# Patient Record
Sex: Male | Born: 1985 | Hispanic: Yes | Marital: Single | Smoking: Never smoker
Health system: Southern US, Community
[De-identification: ages and names within clinical notes are randomized; demographics above are authoritative.]

## PROBLEM LIST (undated history)

## (undated) DIAGNOSIS — I10 Essential (primary) hypertension: Secondary | ICD-10-CM

---

## 2013-12-25 ENCOUNTER — Encounter (HOSPITAL_COMMUNITY): Payer: Self-pay | Admitting: Emergency Medicine

## 2013-12-25 ENCOUNTER — Encounter (HOSPITAL_COMMUNITY): Payer: Self-pay | Admitting: Certified Registered"

## 2013-12-25 ENCOUNTER — Encounter (HOSPITAL_COMMUNITY)
Admission: EM | Disposition: A | Discharge status: Home / Self Care | Payer: Self-pay | Source: Home / Self Care | Attending: Orthopaedic Surgery

## 2013-12-25 ENCOUNTER — Emergency Department (HOSPITAL_COMMUNITY): Payer: Self-pay

## 2013-12-25 ENCOUNTER — Inpatient Hospital Stay (HOSPITAL_COMMUNITY)
Admission: EM | Admit: 2013-12-25 | Discharge: 2013-12-31 | DRG: 577 | Disposition: A | Discharge status: Home / Self Care | Payer: Self-pay | Attending: Orthopaedic Surgery | Admitting: Orthopaedic Surgery

## 2013-12-25 ENCOUNTER — Emergency Department (HOSPITAL_COMMUNITY): Payer: Self-pay | Admitting: Certified Registered"

## 2013-12-25 DIAGNOSIS — S81819A Laceration without foreign body, unspecified lower leg, initial encounter: Secondary | ICD-10-CM | POA: Diagnosis present

## 2013-12-25 DIAGNOSIS — S86929A Laceration of unspecified muscle(s) and tendon(s) at lower leg level, unspecified leg, initial encounter: Secondary | ICD-10-CM

## 2013-12-25 DIAGNOSIS — S81809A Unspecified open wound, unspecified lower leg, initial encounter: Principal | ICD-10-CM

## 2013-12-25 DIAGNOSIS — D62 Acute posthemorrhagic anemia: Secondary | ICD-10-CM | POA: Diagnosis not present

## 2013-12-25 DIAGNOSIS — S81009A Unspecified open wound, unspecified knee, initial encounter: Principal | ICD-10-CM | POA: Diagnosis present

## 2013-12-25 DIAGNOSIS — S91009A Unspecified open wound, unspecified ankle, initial encounter: Principal | ICD-10-CM

## 2013-12-25 DIAGNOSIS — S81812A Laceration without foreign body, left lower leg, initial encounter: Secondary | ICD-10-CM | POA: Diagnosis present

## 2013-12-25 DIAGNOSIS — I10 Essential (primary) hypertension: Secondary | ICD-10-CM | POA: Diagnosis present

## 2013-12-25 HISTORY — PX: I & D EXTREMITY: SHX5045

## 2013-12-25 HISTORY — PX: APPLICATION OF WOUND VAC: SHX5189

## 2013-12-25 HISTORY — DX: Essential (primary) hypertension: I10

## 2013-12-25 LAB — COMPREHENSIVE METABOLIC PANEL
ALT: 85 U/L — AB (ref 0–53)
AST: 70 U/L — ABNORMAL HIGH (ref 0–37)
Albumin: 4.5 g/dL (ref 3.5–5.2)
Alkaline Phosphatase: 82 U/L (ref 39–117)
BUN: 25 mg/dL — AB (ref 6–23)
CALCIUM: 9 mg/dL (ref 8.4–10.5)
CO2: 23 meq/L (ref 19–32)
CREATININE: 1.03 mg/dL (ref 0.50–1.35)
Chloride: 101 mEq/L (ref 96–112)
GFR calc non Af Amer: >90 mL/min (ref >90)
Glucose, Bld: 126 mg/dL — ABNORMAL HIGH (ref 70–99)
Potassium: 3.9 mEq/L (ref 3.7–5.3)
Sodium: 140 mEq/L (ref 137–147)
Total Bilirubin: 0.4 mg/dL (ref 0.3–1.2)
Total Protein: 7.8 g/dL (ref 6.0–8.3)

## 2013-12-25 LAB — CBC
HCT: 41 % (ref 39.0–52.0)
HEMOGLOBIN: 14.2 g/dL (ref 13.0–17.0)
MCH: 31.8 pg (ref 26.0–34.0)
MCHC: 34.6 g/dL (ref 30.0–36.0)
MCV: 91.9 fL (ref 78.0–100.0)
Platelets: 308 10*3/uL (ref 150–400)
RBC: 4.46 MIL/uL (ref 4.22–5.81)
RDW: 12.3 % (ref 11.5–15.5)
WBC: 9.7 10*3/uL (ref 4.0–10.5)

## 2013-12-25 LAB — PROTIME-INR
INR: 1.02 (ref 0.00–1.49)
PROTHROMBIN TIME: 13.2 s (ref 11.6–15.2)

## 2013-12-25 LAB — SAMPLE TO BLOOD BANK

## 2013-12-25 LAB — ETHANOL: Alcohol, Ethyl (B): <11 mg/dL (ref 0–11)

## 2013-12-25 SURGERY — IRRIGATION AND DEBRIDEMENT EXTREMITY
Anesthesia: General | Site: Leg Lower | Laterality: Left

## 2013-12-25 MED ORDER — LACTATED RINGERS IV SOLN
INTRAVENOUS | Status: DC | PRN
  Administered 2013-12-25: 21:00:00 via INTRAVENOUS

## 2013-12-25 MED ORDER — SENNA 8.6 MG PO TABS
1.0000 | ORAL_TABLET | Freq: Two times a day (BID) | ORAL | Status: DC
  Administered 2013-12-26 – 2013-12-31 (×9): 8.6 mg via ORAL
  Filled 2013-12-25 (×13): qty 1

## 2013-12-25 MED ORDER — SODIUM CHLORIDE 0.9 % IJ SOLN
INTRAMUSCULAR | Status: AC
  Filled 2013-12-25: qty 10

## 2013-12-25 MED ORDER — SUFENTANIL CITRATE 50 MCG/ML IV SOLN
INTRAVENOUS | Status: DC | PRN
  Administered 2013-12-25 (×2): 10 ug via INTRAVENOUS

## 2013-12-25 MED ORDER — DEXAMETHASONE SODIUM PHOSPHATE 4 MG/ML IJ SOLN
INTRAMUSCULAR | Status: DC | PRN
  Administered 2013-12-25: 4 mg via INTRAVENOUS

## 2013-12-25 MED ORDER — SUCCINYLCHOLINE CHLORIDE 20 MG/ML IJ SOLN
INTRAMUSCULAR | Status: AC
  Filled 2013-12-25: qty 1

## 2013-12-25 MED ORDER — LIDOCAINE HCL (CARDIAC) 20 MG/ML IV SOLN
INTRAVENOUS | Status: AC
  Filled 2013-12-25: qty 5

## 2013-12-25 MED ORDER — SUCCINYLCHOLINE CHLORIDE 20 MG/ML IJ SOLN
INTRAMUSCULAR | Status: DC | PRN
  Administered 2013-12-25: 120 mg via INTRAVENOUS

## 2013-12-25 MED ORDER — PROPOFOL 10 MG/ML IV BOLUS
INTRAVENOUS | Status: AC
  Filled 2013-12-25: qty 20

## 2013-12-25 MED ORDER — OXYCODONE HCL 5 MG PO TABS
5.0000 mg | ORAL_TABLET | Freq: Once | ORAL | Status: AC | PRN
  Administered 2013-12-25: 5 mg via ORAL

## 2013-12-25 MED ORDER — MIDAZOLAM HCL 2 MG/2ML IJ SOLN
INTRAMUSCULAR | Status: AC
  Filled 2013-12-25: qty 2

## 2013-12-25 MED ORDER — DEXAMETHASONE SODIUM PHOSPHATE 4 MG/ML IJ SOLN
INTRAMUSCULAR | Status: AC
  Filled 2013-12-25: qty 1

## 2013-12-25 MED ORDER — ONDANSETRON HCL 4 MG/2ML IJ SOLN
INTRAMUSCULAR | Status: DC | PRN
  Administered 2013-12-25: 4 mg via INTRAVENOUS

## 2013-12-25 MED ORDER — METOCLOPRAMIDE HCL 10 MG PO TABS: 5.0000 mg | ORAL_TABLET | Freq: Three times a day (TID) | ORAL | Status: DC | PRN

## 2013-12-25 MED ORDER — TETANUS-DIPHTH-ACELL PERTUSSIS 5-2.5-18.5 LF-MCG/0.5 IM SUSP
0.5000 mL | Freq: Once | INTRAMUSCULAR | Status: AC
  Administered 2013-12-25: 0.5 mL via INTRAMUSCULAR
  Filled 2013-12-25: qty 0.5

## 2013-12-25 MED ORDER — DEXTROSE 5 % IV SOLN
200.0000 mg | Freq: Once | INTRAVENOUS | Status: AC
  Administered 2013-12-25: 200 mg via INTRAVENOUS
  Filled 2013-12-25 (×2): qty 5

## 2013-12-25 MED ORDER — HYDROCODONE-ACETAMINOPHEN 5-325 MG PO TABS: 1.0000 | ORAL_TABLET | ORAL | Status: DC | PRN

## 2013-12-25 MED ORDER — ONDANSETRON HCL 4 MG PO TABS: 4.0000 mg | ORAL_TABLET | Freq: Four times a day (QID) | ORAL | Status: DC | PRN

## 2013-12-25 MED ORDER — METOCLOPRAMIDE HCL 5 MG/ML IJ SOLN: 5.0000 mg | Freq: Three times a day (TID) | INTRAMUSCULAR | Status: DC | PRN

## 2013-12-25 MED ORDER — POLYETHYLENE GLYCOL 3350 17 G PO PACK: 17.0000 g | PACK | Freq: Every day | ORAL | Status: DC | PRN

## 2013-12-25 MED ORDER — SODIUM CHLORIDE 0.9 % IV SOLN
INTRAVENOUS | Status: DC
  Administered 2013-12-25 – 2013-12-28 (×3): via INTRAVENOUS

## 2013-12-25 MED ORDER — SUFENTANIL CITRATE 50 MCG/ML IV SOLN
INTRAVENOUS | Status: AC
  Filled 2013-12-25: qty 1

## 2013-12-25 MED ORDER — OXYCODONE HCL 5 MG PO TABS
ORAL_TABLET | ORAL | Status: AC
  Administered 2013-12-25: 5 mg via ORAL
  Filled 2013-12-25: qty 1

## 2013-12-25 MED ORDER — MORPHINE SULFATE 2 MG/ML IJ SOLN
1.0000 mg | INTRAMUSCULAR | Status: DC | PRN
  Administered 2013-12-26 (×6): 1 mg via INTRAVENOUS
  Filled 2013-12-25 (×6): qty 1

## 2013-12-25 MED ORDER — HYDROMORPHONE HCL PF 1 MG/ML IJ SOLN
0.2500 mg | INTRAMUSCULAR | Status: DC | PRN
  Administered 2013-12-25 (×2): 0.5 mg via INTRAVENOUS

## 2013-12-25 MED ORDER — HYDROMORPHONE HCL PF 1 MG/ML IJ SOLN
INTRAMUSCULAR | Status: AC
  Administered 2013-12-25: 0.5 mg via INTRAVENOUS
  Filled 2013-12-25: qty 1

## 2013-12-25 MED ORDER — MIDAZOLAM HCL 5 MG/5ML IJ SOLN
INTRAMUSCULAR | Status: DC | PRN
  Administered 2013-12-25: 2 mg via INTRAVENOUS

## 2013-12-25 MED ORDER — FENTANYL CITRATE 0.05 MG/ML IJ SOLN
100.0000 ug | Freq: Once | INTRAMUSCULAR | Status: AC
  Administered 2013-12-25: 100 ug via INTRAVENOUS
  Filled 2013-12-25: qty 2

## 2013-12-25 MED ORDER — OXYCODONE HCL 5 MG/5ML PO SOLN: 5.0000 mg | Freq: Once | ORAL | Status: AC | PRN

## 2013-12-25 MED ORDER — ONDANSETRON HCL 4 MG/2ML IJ SOLN
INTRAMUSCULAR | Status: AC
  Filled 2013-12-25: qty 2

## 2013-12-25 MED ORDER — SODIUM CHLORIDE 0.9 % IV SOLN
INTRAVENOUS | Status: DC | PRN
  Administered 2013-12-25: 21:00:00 via INTRAVENOUS

## 2013-12-25 MED ORDER — SORBITOL 70 % SOLN: 30.0000 mL | Freq: Every day | Status: DC | PRN

## 2013-12-25 MED ORDER — CEFAZOLIN SODIUM-DEXTROSE 2-3 GM-% IV SOLR
2.0000 g | Freq: Four times a day (QID) | INTRAVENOUS | Status: AC
  Administered 2013-12-26 (×3): 2 g via INTRAVENOUS
  Filled 2013-12-25 (×3): qty 50

## 2013-12-25 MED ORDER — DIAZEPAM 5 MG PO TABS
5.0000 mg | ORAL_TABLET | Freq: Four times a day (QID) | ORAL | Status: DC | PRN
  Administered 2013-12-27 – 2013-12-31 (×11): 5 mg via ORAL
  Filled 2013-12-25 (×11): qty 1

## 2013-12-25 MED ORDER — ONDANSETRON HCL 4 MG/2ML IJ SOLN: 4.0000 mg | Freq: Once | INTRAMUSCULAR | Status: DC | PRN

## 2013-12-25 MED ORDER — OXYCODONE HCL 5 MG PO TABS
5.0000 mg | ORAL_TABLET | ORAL | Status: DC | PRN
  Administered 2013-12-26 (×2): 10 mg via ORAL
  Administered 2013-12-27 – 2013-12-30 (×11): 15 mg via ORAL
  Filled 2013-12-25 (×4): qty 3
  Filled 2013-12-25: qty 2
  Filled 2013-12-25 (×7): qty 3
  Filled 2013-12-25: qty 2
  Filled 2013-12-25 (×3): qty 3

## 2013-12-25 MED ORDER — CEFAZOLIN SODIUM-DEXTROSE 2-3 GM-% IV SOLR
2.0000 g | Freq: Once | INTRAVENOUS | Status: AC
  Administered 2013-12-25: 2 g via INTRAVENOUS
  Filled 2013-12-25: qty 50

## 2013-12-25 MED ORDER — DIPHENHYDRAMINE HCL 12.5 MG/5ML PO ELIX
25.0000 mg | ORAL_SOLUTION | ORAL | Status: DC | PRN
  Administered 2013-12-27: 25 mg via ORAL
  Filled 2013-12-25 (×2): qty 10

## 2013-12-25 MED ORDER — PROPOFOL 10 MG/ML IV BOLUS
INTRAVENOUS | Status: DC | PRN
  Administered 2013-12-25: 200 mg via INTRAVENOUS
  Administered 2013-12-25: 50 mg via INTRAVENOUS

## 2013-12-25 MED ORDER — MAGNESIUM CITRATE PO SOLN: 1.0000 | Freq: Once | ORAL | Status: AC | PRN

## 2013-12-25 MED ORDER — FENTANYL CITRATE 0.05 MG/ML IJ SOLN
50.0000 ug | INTRAMUSCULAR | Status: DC | PRN
  Administered 2013-12-25 (×2): 50 ug via INTRAVENOUS
  Filled 2013-12-25 (×2): qty 2

## 2013-12-25 MED ORDER — PENICILLIN G BENZATHINE 1200000 UNIT/2ML IM SUSP
1.2000 10*6.[IU] | Freq: Once | INTRAMUSCULAR | Status: AC
  Administered 2013-12-25: 1.2 10*6.[IU] via INTRAMUSCULAR
  Filled 2013-12-25: qty 2

## 2013-12-25 MED ORDER — LIDOCAINE HCL (CARDIAC) 20 MG/ML IV SOLN
INTRAVENOUS | Status: DC | PRN
  Administered 2013-12-25: 100 mg via INTRAVENOUS

## 2013-12-25 MED ORDER — ONDANSETRON HCL 4 MG/2ML IJ SOLN: 4.0000 mg | Freq: Four times a day (QID) | INTRAMUSCULAR | Status: DC | PRN

## 2013-12-25 MED ORDER — SODIUM CHLORIDE 0.9 % IR SOLN
Status: DC | PRN
  Administered 2013-12-25 (×2): 3000 mL

## 2013-12-25 SURGICAL SUPPLY — 31 items
BNDG COHESIVE 4X5 TAN STRL (GAUZE/BANDAGES/DRESSINGS) ×3 IMPLANT
CANISTER WOUND CARE 500ML ATS (WOUND CARE) ×3 IMPLANT
COVER SURGICAL LIGHT HANDLE (MISCELLANEOUS) ×3 IMPLANT
CUFF TOURNIQUET SINGLE 34IN LL (TOURNIQUET CUFF) ×3 IMPLANT
DRAPE INCISE IOBAN 66X45 STRL (DRAPES) IMPLANT
DRAPE U-SHAPE 47X51 STRL (DRAPES) ×3 IMPLANT
DRSG EMULSION OIL 3X3 NADH (GAUZE/BANDAGES/DRESSINGS) ×3 IMPLANT
DRSG VAC ATS SM SENSATRAC (GAUZE/BANDAGES/DRESSINGS) ×3 IMPLANT
ELECT CAUTERY BLADE 6.4 (BLADE) ×3 IMPLANT
ELECT REM PT RETURN 9FT ADLT (ELECTROSURGICAL) ×3
ELECTRODE REM PT RTRN 9FT ADLT (ELECTROSURGICAL) ×1 IMPLANT
FACESHIELD WRAPAROUND (MASK) ×3 IMPLANT
GLOVE SURG SS PI 7.5 STRL IVOR (GLOVE) ×6 IMPLANT
GOWN STRL REUS W/ TWL LRG LVL3 (GOWN DISPOSABLE) ×1 IMPLANT
GOWN STRL REUS W/ TWL XL LVL3 (GOWN DISPOSABLE) ×1 IMPLANT
GOWN STRL REUS W/TWL LRG LVL3 (GOWN DISPOSABLE) ×2
GOWN STRL REUS W/TWL XL LVL3 (GOWN DISPOSABLE) ×2
KIT BASIN OR (CUSTOM PROCEDURE TRAY) ×3 IMPLANT
KIT ROOM TURNOVER OR (KITS) ×3 IMPLANT
MANIFOLD NEPTUNE II (INSTRUMENTS) ×3 IMPLANT
NS IRRIG 1000ML POUR BTL (IV SOLUTION) ×3 IMPLANT
PACK ORTHO EXTREMITY (CUSTOM PROCEDURE TRAY) ×3 IMPLANT
PAD ARMBOARD 7.5X6 YLW CONV (MISCELLANEOUS) ×6 IMPLANT
SUT ETHILON 2 0 PSLX (SUTURE) ×3 IMPLANT
TOWEL OR 17X24 6PK STRL BLUE (TOWEL DISPOSABLE) ×3 IMPLANT
TOWEL OR 17X26 10 PK STRL BLUE (TOWEL DISPOSABLE) ×3 IMPLANT
TUBE CONNECTING 12'X1/4 (SUCTIONS) ×1
TUBE CONNECTING 12X1/4 (SUCTIONS) ×2 IMPLANT
TUBING CYSTO DISP (UROLOGICAL SUPPLIES) ×3 IMPLANT
UNDERPAD 30X30 INCONTINENT (UNDERPADS AND DIAPERS) ×3 IMPLANT
YANKAUER SUCT BULB TIP NO VENT (SUCTIONS) ×3 IMPLANT

## 2013-12-25 NOTE — H&P (Addendum)
ORTHOPAEDIC HISTORY AND PHYSICAL   Chief Complaint: left leg injury  HPI: Cory Lowery is a 28 y.o. hispanic male who complains of left leg injury after a forklift ran into his left lateral calf.  He was evaluated at an outside ER and transferred here.  Ancef, gentamicin, PCN given in the ER.  Last po intake was 10 am.  Denies any LOC, neck pain, abd pain.  Complaining of numbness in the entire foot.  Ortho consulted for injury.  Past Medical History  Diagnosis Date  . Hypertension    History reviewed. No pertinent past surgical history. History   Social History  . Marital Status: Single    Spouse Name: N/A    Number of Children: N/A  . Years of Education: N/A   Social History Main Topics  . Smoking status: None  . Smokeless tobacco: None  . Alcohol Use: None  . Drug Use: None  . Sexual Activity: None   Other Topics Concern  . None   Social History Narrative  . None   No family history on file. Allergies  Allergen Reactions  . Aleve [Naproxen Sodium]     Itching    Prior to Admission medications   Not on File   Dg Chest 2 View  12/25/2013   CLINICAL DATA:  Left leg injury.  EXAM: CHEST  2 VIEW  COMPARISON:  PA and lateral chest 04/21/2017.  FINDINGS: Lung volumes are low but the lungs are clear. Heart size is normal. No pneumothorax or pleural effusion. No focal bony abnormality.  IMPRESSION: Negative exam.   Electronically Signed   By: Drusilla Kannerhomas  Dalessio M.D.   On: 12/25/2013 17:34   Dg Lumbar Spine Complete  12/25/2013   CLINICAL DATA:  Injured back at work.  EXAM: LUMBAR SPINE - COMPLETE 4+ VIEW  COMPARISON:  None.  FINDINGS: Normal alignment of the lumbar vertebral bodies. Disc spaces and vertebral bodies are maintained. Could not exclude pars defects at L5 but no spondylolisthesis. The visualized bony pelvis is intact.  IMPRESSION: Normal alignment and no acute bony findings or significant degenerative changes.  Possible pars defects at L5 but no  spondylolisthesis.   Electronically Signed   By: Loralie ChampagneMark  Gallerani M.D.   On: 12/25/2013 17:33   Dg Tibia/fibula Left  12/25/2013   CLINICAL DATA:  Open wound.  EXAM: LEFT TIBIA AND FIBULA - 2 VIEW  COMPARISON:  None.  FINDINGS: The knee and ankle joints are maintained. No acute bony abnormality involving the tibia or fibula. There is a soft tissue defect involving the lateral aspect of the mid lower extremity.  IMPRESSION: No acute fracture or radiopaque foreign body.   Electronically Signed   By: Loralie ChampagneMark  Gallerani M.D.   On: 12/25/2013 17:47    Positive ROS: All other systems have been reviewed and were otherwise negative with the exception of those mentioned in the HPI and as above.  Physical Exam: General: Alert, no acute distress Cardiovascular: No pedal edema Respiratory: No cyanosis, no use of accessory musculature GI: No organomegaly, abdomen is soft and non-tender Skin: No lesions in the area of chief complaint Neurologic: Sensation intact distally Psychiatric: Patient is competent for consent with normal mood and affect Lymphatic: No axillary or cervical lymphadenopathy  MUSCULOSKELETAL:  - 10 cm circular wound to the anterolateral calf with exposed muscle and tendon, skin retracted - moderate gross contaminated - able to dorsiflex and plantarflex foot - foot wwp, 2+ pulses - paresthesias in the entire foot in no  dermatomal pattern - compartments soft  Assessment: Left leg injury with significant soft tissue injury, negative for fracture  Plan: - needs formal I&D in OR, may need delayed closure or skin grafting - ancef, gentamicin, pcn, tdap given in ER - consent signed after discussing r/b/a through translator - all questions answered to their satisfaction   N. Glee ArvinMichael Coila Wardell, MD [facility] [phone] 8:10 PM

## 2013-12-25 NOTE — Transfer of Care (Signed)
Immediate Anesthesia Transfer of Care Note  Patient: Cory Lowery  Procedure(s) Performed: Procedure(s): IRRIGATION AND DEBRIDEMENT LEFT LEG (Left) APPLICATION OF WOUND VAC (Left)  Patient Location: PACU  Anesthesia Type:General  Level of Consciousness: awake, alert , oriented and patient cooperative  Airway & Oxygen Therapy: Patient Spontanous Breathing and Patient connected to nasal cannula oxygen  Post-op Assessment: Report given to PACU RN, Post -op Vital signs reviewed and stable and Patient moving all extremities X 4  Post vital signs: Reviewed and stable  Complications: No apparent anesthesia complications

## 2013-12-25 NOTE — ED Notes (Signed)
Patient transported to short stay by this RN. [facility] No questions/concerns. All patient belongings in possession of patients girlfriend.

## 2013-12-25 NOTE — ED Provider Notes (Signed)
CSN: [id number]     Arrival date & time 12/25/13  1545 History   First MD Initiated Contact with Patient 12/25/13 1546     Chief Complaint  Patient presents with  . Leg Injury     (Consider location/radiation/quality/duration/timing/severity/associated sxs/prior Treatment) The history is provided by the patient and medical records. No language interpreter was used.    Cory Lowery is a 28 y.o. male  with no known medical history presents to the Emergency Department complaining of acute fracture to the left leg onset approximately 30 minutes prior to arrival. Patient presents via [facility]. 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  Coworkers on scene attempted to fashion a tourniquet was unable to take in enough to occlude arterial bleeding.  EMS reports wound was wrapped with a pressure dressing by the fire department prior to their arrival but it was reported that he had an open tib-fib fracture.  Patient specifically denies falling, hitting his head or loss of consciousness. He denies neck pain or back pain.  He rapidly mobilized the EMS. Patient given morphine 10 mg prior to arrival with only moderate reduction in pain.  EMS reports hemodynamic stability. No aggravating or alleviating factors  Pt denies fever, headache, abdominal pain, chest pain, shortness of breath, nausea, vomiting, numbness, tingling.     Past Medical History  Diagnosis Date  . Hypertension    History reviewed. No pertinent past surgical history. No family history on file. History  Substance Use Topics  . Smoking status: Not on file  . Smokeless tobacco: Not on file  . Alcohol Use: Not on file    Review of Systems  Constitutional: Negative for fever and chills.  HENT: Negative for dental problem, facial swelling and nosebleeds.   Eyes: Negative for visual disturbance.  Respiratory: Negative for cough, chest tightness,  shortness of breath, wheezing and stridor.   Cardiovascular: Negative for chest pain.  Gastrointestinal: Negative for nausea, vomiting and abdominal pain.  Genitourinary: Negative for dysuria, hematuria and flank pain.  Musculoskeletal: Positive for arthralgias, gait problem, joint swelling and myalgias. Negative for back pain, neck pain and neck stiffness.  Skin: Negative for rash and wound.  Neurological: Negative for syncope, weakness, light-headedness, numbness and headaches.  Hematological: Does not bruise/bleed easily.  Psychiatric/Behavioral: The patient is not nervous/anxious.   All other systems reviewed and are negative.     Allergies  Aleve  Home Medications   Prior to Admission medications   Not on File   BP 136/88  Pulse 91  Temp(Src) 98.9 F (37.2 C) (Oral)  Resp 14  Ht 6' (1.829 m)  Wt 197 lb (89.359 kg)  BMI 26.71 kg/m2  SpO2 100% Physical Exam  Nursing note and vitals reviewed. Constitutional: He is oriented to person, place, and time. He appears well-developed and well-nourished. No distress.  HENT:  Head: Normocephalic and atraumatic.  Nose: Nose normal.  Mouth/Throat: Uvula is midline, oropharynx is clear and moist and mucous membranes are normal.  Eyes: Conjunctivae and EOM are normal. Pupils are equal, round, and reactive to light.  Neck: Normal range of motion and full passive range of motion without pain. Neck supple. No spinous process tenderness and no muscular tenderness present. No rigidity. Normal range of motion present.  Full ROM without pain No midline cervical tenderness No paraspinal tenderness  Cardiovascular: Normal rate, regular rhythm, normal heart sounds and intact distal pulses.   No murmur heard. Pulses:  Radial pulses are 2+ on the right side, and 2+ on the left side.       Dorsalis pedis pulses are 2+ on the right side, and 2+ on the left side.       Posterior tibial pulses are 2+ on the right side, and 2+ on the left  side.  Skin warm and dry Capillary refill < 3 sec  Pulmonary/Chest: Effort normal and breath sounds normal. No accessory muscle usage. No respiratory distress. He has no decreased breath sounds. He has no wheezes. He has no rhonchi. He has no rales. He exhibits no tenderness and no bony tenderness.  No contusion or ecchymosis No flail segment, crepitus or deformity  Abdominal: Soft. Normal appearance and bowel sounds are normal. He exhibits no distension. There is no tenderness. There is no rigidity, no guarding and no CVA tenderness.  No contusion or ecchymosis Abd soft and nontender  Musculoskeletal: Normal range of motion. He exhibits tenderness.       Thoracic back: He exhibits normal range of motion.       Lumbar back: He exhibits normal range of motion.  Full range of motion of the T-spine and L-spine No tenderness to palpation of the spinous processes of the T-spine; Mild tenderness to palpation of the spinous processes of the L-spine No tenderness to palpation of the paraspinous muscles of the L-spine Full range of motion of all joints in the upper extremities Full range of motion of all joints on the right extremity Decreased range of motion of the left ankle and toes secondary to pain; range of motion to left knee and hip not tested Compartments sodt  Lymphadenopathy:    He has no cervical adenopathy.  Neurological: He is alert and oriented to person, place, and time. No cranial nerve deficit. GCS eye subscore is 4. GCS verbal subscore is 5. GCS motor subscore is 6.  Reflex Scores:      Tricep reflexes are 2+ on the right side and 2+ on the left side.      Bicep reflexes are 2+ on the right side and 2+ on the left side.      Brachioradialis reflexes are 2+ on the right side and 2+ on the left side.      Patellar reflexes are 2+ on the right side and 2+ on the left side.      Achilles reflexes are 2+ on the right side and 2+ on the left side. Speech is clear and goal oriented,  follows commands Normal strength in upper bilaterally including strong and equal grip strength 5/5 strength in the right lower extremity;  Strength not tested in the left lower [place] due to open tib-fib fracture Sensation normal to light and sharp touch in bilateral lower extremities Moves extremities without ataxia, coordination intact   Skin: Skin is warm and dry. He is not diaphoretic.  10 cm circular wound to the anterolateral calf with exposed muscle and tendon, skin retracted with gross contamination  Psychiatric: He has a normal mood and affect.    ED Course  Procedures (including critical care time) Labs Review Labs Reviewed  COMPREHENSIVE METABOLIC PANEL - Abnormal; Notable for the following:    Glucose, Bld 126 (*)    BUN 25 (*)    AST 70 (*)    ALT 85 (*)    All other components within normal limits  CBC  ETHANOL  PROTIME-INR  CDS SEROLOGY  SAMPLE TO BLOOD BANK    Imaging Review Dg Chest  2 View  12/25/2013   CLINICAL DATA:  Left leg injury.  EXAM: CHEST  2 VIEW  COMPARISON:  PA and lateral chest 04/21/2017.  FINDINGS: Lung volumes are low but the lungs are clear. Heart size is normal. No pneumothorax or pleural effusion. No focal bony abnormality.  IMPRESSION: Negative exam.   Electronically Signed   By: Drusilla Kannerhomas  Dalessio M.D.   On: 12/25/2013 17:34   Dg Lumbar Spine Complete  12/25/2013   CLINICAL DATA:  Injured back at work.  EXAM: LUMBAR SPINE - COMPLETE 4+ VIEW  COMPARISON:  None.  FINDINGS: Normal alignment of the lumbar vertebral bodies. Disc spaces and vertebral bodies are maintained. Could not exclude pars defects at L5 but no spondylolisthesis. The visualized bony pelvis is intact.  IMPRESSION: Normal alignment and no acute bony findings or significant degenerative changes.  Possible pars defects at L5 but no spondylolisthesis.   Electronically Signed   By: Loralie ChampagneMark  Gallerani M.D.   On: 12/25/2013 17:33   Dg Tibia/fibula Left  12/25/2013   CLINICAL DATA:  Open  wound.  EXAM: LEFT TIBIA AND FIBULA - 2 VIEW  COMPARISON:  None.  FINDINGS: The knee and ankle joints are maintained. No acute bony abnormality involving the tibia or fibula. There is a soft tissue defect involving the lateral aspect of the mid lower extremity.  IMPRESSION: No acute fracture or radiopaque foreign body.   Electronically Signed   By: Loralie ChampagneMark  Gallerani M.D.   On: 12/25/2013 17:47     EKG Interpretation None      MDM   Final diagnoses:  Lower leg laceration involving tendon  Laceration of lower leg, left, complicated   Cory Lowery presents with suspected open tib-fib fracture of the left leg. Patient struck by a forklift while seated on the machine. He denies falling, hitting his head or loss of consciousness. On exam patient with large laceration to left lower leg, mild midline spinous tenderness to the L-spine without paraspinal tenderness.    Will obtain trauma labs, x-ray left leg, L-spine.  Pt with negative NEXUS (no focal feurologic deficit, midline spinal tenderness, ALOC, intoxication or distracting injury); and cervical spine x-rays indicated at this time  6:14PM X-ray without acute fracture or radiopaque foreign body of the tib fib; lumbar spine with possible pars defect at L5 but no spondylolisthesis. Normal alignment without acute bony findings.  I personally reviewed the imaging tests through PACS system  I reviewed available ER/hospitalization records through the EMR  Wound explored with evidence of partial perroneus rupture.  Motor branch of the peroneal nerve appears intact however deficit appears to the sensory branch as patient has no decreased sensation to the lateral portion of the left foot. Patient initially was sensation to this area on first exam; but repeat exam shows sensation deficit.  Will consult ortho for further evaluation.  7:54 PM Discussed with Dr. Roda ShuttersXu who will evaluate.  Recommends Ancef 2g IV.    8:45PM Pt also getting Gentamycin  and penicillin.  Pt to OR for repair.    BP 136/88  Pulse 91  Temp(Src) 98.9 F (37.2 C) (Oral)  Resp 14  Ht 6' (1.829 m)  Wt 197 lb (89.359 kg)  BMI 26.71 kg/m2  SpO2 100%    Dierdre ForthHannah Audre Cenci, PA-C 12/26/13 0056

## 2013-12-25 NOTE — ED Notes (Signed)
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 Left leg has an open fracture with pulses present open arrival. Vitals per EMS: 138/82, ST 124 HR, 18 RR, and 97 % on 2L.

## 2013-12-25 NOTE — Anesthesia Procedure Notes (Signed)
Procedure Name: Intubation Date/Time: 12/25/2013 8:48 PM Performed by: Claris Che Pre-anesthesia Checklist: Patient identified, Emergency Drugs available, Suction available and Patient being monitored Patient Re-evaluated:Patient Re-evaluated prior to inductionOxygen Delivery Method: Circle system utilized Preoxygenation: Pre-oxygenation with 100% oxygen Intubation Type: IV induction, Rapid sequence and Cricoid Pressure applied Ventilation: Mask ventilation without difficulty Laryngoscope Size: Mac and 3 Grade View: Grade I Tube type: Oral Tube size: 8.0 mm Number of attempts: 1 Airway Equipment and Method: Stylet and LTA kit utilized Placement Confirmation: ETT inserted through vocal cords under direct vision,  positive ETCO2 and breath sounds checked- equal and bilateral Secured at: 23 cm Tube secured with: Tape Dental Injury: Teeth and Oropharynx as per pre-operative assessment

## 2013-12-25 NOTE — Anesthesia Postprocedure Evaluation (Signed)
  Anesthesia Post-op Note  Patient: Cory ChalkJairo Ramirez-Malpica  Procedure(s) Performed: Procedure(s): IRRIGATION AND DEBRIDEMENT LEFT LEG (Left) APPLICATION OF WOUND VAC (Left)  Patient Location: PACU  Anesthesia Type:General  Level of Consciousness: awake, alert, oriented  Airway and Oxygen Therapy: Patient Spontanous Breathing and Patient connected to nasal cannula oxygen  Post-op Pain: none  Post-op Assessment: Post-op Vital signs reviewed  Post-op Vital Signs: Reviewed  Last Vitals:  Filed Vitals:   12/25/13 2145  BP:   Pulse: 115  Temp:   Resp: 16    Complications: No apparent anesthesia complications

## 2013-12-25 NOTE — Op Note (Signed)
   Date of Surgery: 12/25/2013  INDICATIONS: Mr. Cory Lowery is a 28 y.o.-year-old male who sustained a complicated left lower leg laceration with muscle and tendon involvement to the anterior compartment from a forklift ;  The patient and family did consent to the procedure after discussion of the risks and benefits.  PREOPERATIVE DIAGNOSIS: Complicated laceration with tendon and muscle involvement of left lower leg anterior compartment.  POSTOPERATIVE DIAGNOSIS: Same.  PROCEDURE:  1. Debridement of left leg muscle, skin, subcutaneous tissue 2. Application of negative wound therapy <50 sq cm  SURGEON: N. Glee ArvinMichael Xu, M.D.  ASSIST: none.  ANESTHESIA:  general  IV FLUIDS AND URINE: See anesthesia.  ESTIMATED BLOOD LOSS: minimal mL.  IMPLANTS: none  DRAINS: wound vac  COMPLICATIONS: None.  DESCRIPTION OF PROCEDURE: The patient was brought to the operating room and placed supine on the operating table.  The patient had been signed prior to the procedure and this was documented. The patient had the anesthesia placed by the anesthesiologist.  A time-out was performed to confirm that this was the correct patient, site, side and location. The patient had an SCD on the opposite lower extremity. The patient did receive antibiotics prior to the incision and was re-dosed during the procedure as needed at indicated intervals.  The patient had the operative extremity prepped and draped in the standard surgical fashion.    I first started with sharp excisional debridement of the skin muscle and subcutaneous tissue. I sharply excised and the skin that appeared to be nonviable. This was excised back to a bleeding border with a knife. I then performed sharp excisional debridement of the muscle and subcutaneous tissue using a rongeur. Any gross contamination was debrided. After thorough debridement the wound measured 65 mm x 75 mm x 40 mm deep.  I then irrigated the wound with 6 L of normal saline  using cystoscopy tubing. I then loosely closed the skin over the wound. I was not able to get the entire wound closed therefore I placed a wound VAC over the remaining exposed tissue. A sterile dressing was applied. The patient was placed in a Cam Walker boot. The patient was extubated and transferred to the PACU in stable condition.  POSTOPERATIVE PLAN: The patient will be weightbearing as tolerated in the cam boot. He will need to elevate his extremity when he does not need to be weightbearing. I plan on bringing him back in about 48-72 hours for another look at the wound and to check the viability of the skin.  Mayra ReelN. Michael Xu, MD [facility] [phone] 8:32 PM

## 2013-12-25 NOTE — Progress Notes (Signed)
Report to D. Rees RN as primary caregiver 

## 2013-12-25 NOTE — Progress Notes (Signed)
Orthopedic Tech Progress Note Patient Details:  Cory ChalkJairo Lowery [birth year] [id number]  Ortho Devices Type of Ortho Device: CAM walker Ortho Device/Splint Interventions: Ordered   Jennye Moccasinnthony Craig [name] 12/25/2013, 8:58 PM

## 2013-12-25 NOTE — Anesthesia Preprocedure Evaluation (Signed)
Anesthesia Evaluation  Patient identified by MRN, date of birth, ID band Patient awake    Reviewed: Allergy & Precautions, H&P , NPO status , Patient's Chart, lab work & pertinent test results  Airway Mallampati: I TM Distance: >3 FB Neck ROM: Full    Dental  (+) Teeth Intact, Dental Advisory Given   Pulmonary  breath sounds clear to auscultation        Cardiovascular hypertension (Takes no meds), Rhythm:Regular Rate:Normal     Neuro/Psych    GI/Hepatic   Endo/Other    Renal/GU      Musculoskeletal   Abdominal   Peds  Hematology   Anesthesia Other Findings   Reproductive/Obstetrics                           Anesthesia Physical Anesthesia Plan  ASA: II  Anesthesia Plan: General   Post-op Pain Management:    Induction: Intravenous, Rapid sequence and Cricoid pressure planned  Airway Management Planned: Oral ETT  Additional Equipment:   Intra-op Plan:   Post-operative Plan: Extubation in OR  Informed Consent: I have reviewed the patients History and Physical, chart, labs and discussed the procedure including the risks, benefits and alternatives for the proposed anesthesia with the patient or authorized representative who has indicated his/her understanding and acceptance.   Dental advisory given  Plan Discussed with: CRNA, Anesthesiologist and Surgeon  Anesthesia Plan Comments:         Anesthesia Quick Evaluation

## 2013-12-26 LAB — CDS SEROLOGY

## 2013-12-26 MED ORDER — CEFAZOLIN SODIUM-DEXTROSE 2-3 GM-% IV SOLR
2.0000 g | INTRAVENOUS | Status: DC
  Filled 2013-12-26: qty 50

## 2013-12-26 MED ORDER — CHLORHEXIDINE GLUCONATE 4 % EX LIQD
60.0000 mL | Freq: Once | CUTANEOUS | Status: DC
  Filled 2013-12-26: qty 60

## 2013-12-26 MED ORDER — ASPIRIN 81 MG PO CHEW
324.0000 mg | CHEWABLE_TABLET | Freq: Every day | ORAL | Status: DC
  Administered 2013-12-26 – 2013-12-31 (×6): 324 mg via ORAL
  Filled 2013-12-26 (×5): qty 4

## 2013-12-26 MED ORDER — SODIUM CHLORIDE 0.9 % IV SOLN
INTRAVENOUS | Status: DC
  Administered 2013-12-27: 125 mL/h via INTRAVENOUS

## 2013-12-26 NOTE — Evaluation (Signed)
Physical Therapy Evaluation Patient Details Name: Cory Lowery MRN: [id number] DOB: 04/09/1986 Today's Date: 12/26/2013   History of Present Illness  admitted 12/25/13 after forklift accident and sustained complicated laceration to lower leg, s/p I and D and application of wound VAC.  Clinical Impression  Pt tolerated ambulation with RW. Pt understands basic instructions in English, some demonstration required. Will progress to crutches next visit. Pt will benefit from PT while in Acute care to address problems listed below to return to modified independence.    Follow Up Recommendations No PT follow up    Equipment Recommendations  Rolling walker with 5" wheels;Crutches (may be safe on crutches.)    Recommendations for Other Services       Precautions / Restrictions Precautions Precautions: Fall Required Braces or Orthoses: Other Brace/Splint Other Brace/Splint: CAM boot Restrictions LLE Weight Bearing: Weight bearing as tolerated      Mobility  Bed Mobility Overal bed mobility: Independent                Transfers Overall transfer level: Needs assistance Equipment used: Rolling walker (2 wheeled) Transfers: Sit to/from Stand Sit to Stand: Supervision         General transfer comment: [facility]  Ambulation/Gait Ambulation/Gait assistance: Supervision Ambulation Distance (Feet): 200 Feet Assistive device: Rolling walker (2 wheeled)       General Gait Details: cues for safety  Stairs            Wheelchair Mobility    Modified Rankin (Stroke Patients Only)       Balance                                             Pertinent Vitals/Pain 8 L lower leg, received IV meds., instructed in elevation.    Home Living Family/patient expects to be discharged to:: Private residence Living Arrangements: Spouse/significant other Available Help at Discharge: Family Type of Home: Mobile home Home Access:  Stairs to enter Entrance Stairs-Rails: Doctor, general practiceight;Left Entrance Stairs-Number of Steps: 3 Home Layout: One level Home Equipment: None      Prior Function Level of Independence: Independent               Hand Dominance        Extremity/Trunk Assessment   Upper Extremity Assessment: Overall WFL for tasks assessed           Lower Extremity Assessment: LLE deficits/detail   LLE Deficits / Details: able to lift leg, tolerates WBAT     Communication   Communication: Prefers language other than English ([place])  Cognition Arousal/Alertness: Awake/alert Behavior During Therapy: WFL for tasks assessed/performed Overall Cognitive Status: Within Functional Limits for tasks assessed                      General Comments      Exercises        Assessment/Plan    PT Assessment Patient needs continued PT services  PT Diagnosis Difficulty walking;Acute pain   PT Problem List Decreased activity tolerance;Decreased mobility;Pain;Decreased safety awareness;Decreased knowledge of use of DME  PT Treatment Interventions DME instruction;Gait training;Stair training;Functional mobility training;Therapeutic activities;Patient/family education   PT Goals (Current goals can be found in the Care Plan section) Acute Rehab PT Goals Patient Stated Goal: agreed to walk PT Goal Formulation: With patient Time For Goal Achievement: 01/02/14 Potential to Achieve  Goals: Good    Frequency Min 6X/week   Barriers to discharge        Co-evaluation               End of Session Equipment Utilized During Treatment: Other (comment) (CAM boot.) Activity Tolerance: Patient tolerated treatment well Patient left: in bed;with call bell/phone within reach Nurse Communication: Mobility status         Time: [id number] PT Time Calculation (min): 33 min   Charges:   PT Evaluation $Initial PT Evaluation Tier I: 1 Procedure PT Treatments $Gait Training: 23-37 mins   PT G  Codes:          Rada HayKaren Elizabeth Hamdi Lowery 12/26/2013, 12:34 PM Blanchard KelchKaren Laquanda Lowery PT [phone]

## 2013-12-26 NOTE — Progress Notes (Signed)
   Subjective:  Patient reports pain as mild.    Objective:   VITALS:   Filed Vitals:   12/25/13 2230 12/25/13 2245 12/25/13 2322 12/26/13 0434  BP: 134/86 133/80 136/88 145/83  Pulse: 90 89 91 88  Temp:  99.4 F (37.4 C) 98.9 F (37.2 C) 98.4 F (36.9 C)  TempSrc:   Oral Oral  Resp: 18 15 14 14   Height:      Weight:   89.359 kg (197 lb)   SpO2: 100% 100% 100% 100%    Neurologically intact Neurovascular intact Sensation intact distally Intact pulses distally Dorsiflexion/Plantar flexion intact Incision: dressing C/D/I and no drainage No cellulitis present Compartment soft wound vac in place   Lab Results  Component Value Date   WBC 9.7 12/25/2013   HGB 14.2 12/25/2013   HCT 41.0 12/25/2013   MCV 91.9 12/25/2013   PLT 308 12/25/2013     Assessment/Plan:  1 Day Post-Op   - Expected postop acute blood loss anemia - will monitor for symptoms - Up with PT/OT - DVT ppx - SCDs, ambulation, asa - WBAT left lower extremity - Pain control - continue VAC - plan to return to OR tomorrow for repeat I&D and possible closure  Problem List Items Addressed This Visit     Other   *Laceration of lower leg, left, complicated    Other Visit Diagnoses   Lower leg laceration involving tendon    -  Primary        Cheral Almasaiping Michael Xu 12/26/2013, 7:47 AM [phone]

## 2013-12-26 NOTE — Progress Notes (Signed)
Orthopedic Tech Progress Note Patient Details:  Cory ChalkJairo Lowery 08-19-1985 [id number]  Ortho Devices Type of Ortho Device: CAM walker Ortho Device/Splint Interventions: Art BuffOrdered   [name] 12/26/2013, 1:04 AM

## 2013-12-27 ENCOUNTER — Encounter (HOSPITAL_COMMUNITY): Payer: Self-pay | Admitting: Orthopaedic Surgery

## 2013-12-27 ENCOUNTER — Inpatient Hospital Stay (HOSPITAL_COMMUNITY): Payer: Self-pay | Admitting: Certified Registered"

## 2013-12-27 ENCOUNTER — Encounter (HOSPITAL_COMMUNITY): Payer: Self-pay | Admitting: Certified Registered"

## 2013-12-27 ENCOUNTER — Encounter (HOSPITAL_COMMUNITY)
Admission: EM | Disposition: A | Discharge status: Home / Self Care | Payer: Self-pay | Source: Home / Self Care | Attending: Orthopaedic Surgery

## 2013-12-27 HISTORY — PX: I & D EXTREMITY: SHX5045

## 2013-12-27 SURGERY — IRRIGATION AND DEBRIDEMENT EXTREMITY
Anesthesia: General | Site: Leg Lower | Laterality: Left

## 2013-12-27 MED ORDER — MIDAZOLAM HCL 2 MG/2ML IJ SOLN
INTRAMUSCULAR | Status: AC
  Filled 2013-12-27: qty 2

## 2013-12-27 MED ORDER — OXYCODONE HCL 5 MG/5ML PO SOLN: 5.0000 mg | Freq: Once | ORAL | Status: DC | PRN

## 2013-12-27 MED ORDER — SODIUM CHLORIDE 0.9 % IR SOLN
Status: DC | PRN
Start: 2013-12-27 — End: 2013-12-27
  Administered 2013-12-27: 3000 mL

## 2013-12-27 MED ORDER — SUFENTANIL CITRATE 50 MCG/ML IV SOLN
INTRAVENOUS | Status: AC
  Filled 2013-12-27: qty 1

## 2013-12-27 MED ORDER — SODIUM CHLORIDE 0.9 % IJ SOLN
INTRAMUSCULAR | Status: AC
  Filled 2013-12-27: qty 10

## 2013-12-27 MED ORDER — LIDOCAINE HCL (CARDIAC) 20 MG/ML IV SOLN
INTRAVENOUS | Status: AC
  Filled 2013-12-27: qty 5

## 2013-12-27 MED ORDER — LACTATED RINGERS IV SOLN
INTRAVENOUS | Status: DC
  Administered 2013-12-27: 12:00:00 via INTRAVENOUS

## 2013-12-27 MED ORDER — LACTATED RINGERS IV SOLN
INTRAVENOUS | Status: DC | PRN
  Administered 2013-12-27: 12:00:00 via INTRAVENOUS

## 2013-12-27 MED ORDER — DEXAMETHASONE SODIUM PHOSPHATE 4 MG/ML IJ SOLN
INTRAMUSCULAR | Status: AC
  Filled 2013-12-27: qty 1

## 2013-12-27 MED ORDER — GLYCOPYRROLATE 0.2 MG/ML IJ SOLN
INTRAMUSCULAR | Status: AC
  Filled 2013-12-27: qty 1

## 2013-12-27 MED ORDER — ONDANSETRON HCL 4 MG/2ML IJ SOLN
INTRAMUSCULAR | Status: AC
  Filled 2013-12-27: qty 2

## 2013-12-27 MED ORDER — GLYCOPYRROLATE 0.2 MG/ML IJ SOLN
INTRAMUSCULAR | Status: DC | PRN
  Administered 2013-12-27: 0.2 mg via INTRAVENOUS

## 2013-12-27 MED ORDER — ONDANSETRON HCL 4 MG/2ML IJ SOLN: 4.0000 mg | Freq: Four times a day (QID) | INTRAMUSCULAR | Status: DC | PRN

## 2013-12-27 MED ORDER — HYDROMORPHONE HCL PF 1 MG/ML IJ SOLN: 0.2500 mg | INTRAMUSCULAR | Status: DC | PRN

## 2013-12-27 MED ORDER — PROPOFOL 10 MG/ML IV BOLUS
INTRAVENOUS | Status: DC | PRN
  Administered 2013-12-27: 200 mg via INTRAVENOUS

## 2013-12-27 MED ORDER — LIDOCAINE HCL (CARDIAC) 20 MG/ML IV SOLN
INTRAVENOUS | Status: DC | PRN
  Administered 2013-12-27: 100 mg via INTRAVENOUS

## 2013-12-27 MED ORDER — PROPOFOL 10 MG/ML IV BOLUS
INTRAVENOUS | Status: AC
  Filled 2013-12-27: qty 20

## 2013-12-27 MED ORDER — OXYCODONE HCL 5 MG PO TABS: 5.0000 mg | ORAL_TABLET | Freq: Once | ORAL | Status: DC | PRN

## 2013-12-27 MED ORDER — MIDAZOLAM HCL 5 MG/5ML IJ SOLN
INTRAMUSCULAR | Status: DC | PRN
  Administered 2013-12-27: 2 mg via INTRAVENOUS

## 2013-12-27 MED ORDER — SUFENTANIL CITRATE 50 MCG/ML IV SOLN
INTRAVENOUS | Status: DC | PRN
  Administered 2013-12-27: 10 ug via INTRAVENOUS

## 2013-12-27 SURGICAL SUPPLY — 62 items
BANDAGE CONFORM 3  STR LF (GAUZE/BANDAGES/DRESSINGS) IMPLANT
BANDAGE ELASTIC 3 VELCRO ST LF (GAUZE/BANDAGES/DRESSINGS) IMPLANT
BLADE SURG 10 STRL SS (BLADE) ×3 IMPLANT
BNDG COHESIVE 1X5 TAN STRL LF (GAUZE/BANDAGES/DRESSINGS) IMPLANT
BNDG COHESIVE 4X5 TAN STRL (GAUZE/BANDAGES/DRESSINGS) ×3 IMPLANT
BNDG COHESIVE 6X5 TAN STRL LF (GAUZE/BANDAGES/DRESSINGS) IMPLANT
BNDG GAUZE STRTCH 6 (GAUZE/BANDAGES/DRESSINGS) IMPLANT
CORDS BIPOLAR (ELECTRODE) IMPLANT
COVER SURGICAL LIGHT HANDLE (MISCELLANEOUS) ×3 IMPLANT
CUFF TOURNIQUET SINGLE 24IN (TOURNIQUET CUFF) IMPLANT
CUFF TOURNIQUET SINGLE 34IN LL (TOURNIQUET CUFF) IMPLANT
CUFF TOURNIQUET SINGLE 44IN (TOURNIQUET CUFF) IMPLANT
DRAPE EXTREMITY BILATERAL (DRAPE) IMPLANT
DRAPE IMP U-DRAPE 54X76 (DRAPES) IMPLANT
DRAPE INCISE IOBAN 66X45 STRL (DRAPES) ×3 IMPLANT
DRAPE SURG 17X23 STRL (DRAPES) IMPLANT
DRAPE U-SHAPE 47X51 STRL (DRAPES) ×3 IMPLANT
DRSG ADAPTIC 3X8 NADH LF (GAUZE/BANDAGES/DRESSINGS) ×3 IMPLANT
DRSG VAC ATS SM SENSATRAC (GAUZE/BANDAGES/DRESSINGS) ×3 IMPLANT
DURAPREP 26ML APPLICATOR (WOUND CARE) ×3 IMPLANT
ELECT CAUTERY BLADE 6.4 (BLADE) ×3 IMPLANT
ELECT REM PT RETURN 9FT ADLT (ELECTROSURGICAL)
ELECTRODE REM PT RTRN 9FT ADLT (ELECTROSURGICAL) IMPLANT
FACESHIELD WRAPAROUND (MASK) IMPLANT
GAUZE XEROFORM 1X8 LF (GAUZE/BANDAGES/DRESSINGS) ×3 IMPLANT
GAUZE XEROFORM 5X9 LF (GAUZE/BANDAGES/DRESSINGS) ×3 IMPLANT
GLOVE SURG SS PI 7.5 STRL IVOR (GLOVE) ×6 IMPLANT
GOWN STRL REUS W/ TWL LRG LVL3 (GOWN DISPOSABLE) ×1 IMPLANT
GOWN STRL REUS W/ TWL XL LVL3 (GOWN DISPOSABLE) ×1 IMPLANT
GOWN STRL REUS W/TWL LRG LVL3 (GOWN DISPOSABLE) ×2
GOWN STRL REUS W/TWL XL LVL3 (GOWN DISPOSABLE) ×2
HANDPIECE INTERPULSE COAX TIP (DISPOSABLE)
KIT BASIN OR (CUSTOM PROCEDURE TRAY) ×3 IMPLANT
KIT ROOM TURNOVER OR (KITS) ×3 IMPLANT
MANIFOLD NEPTUNE II (INSTRUMENTS) ×3 IMPLANT
NS IRRIG 1000ML POUR BTL (IV SOLUTION) ×6 IMPLANT
PACK ORTHO EXTREMITY (CUSTOM PROCEDURE TRAY) ×3 IMPLANT
PAD ABD 8X10 STRL (GAUZE/BANDAGES/DRESSINGS) ×3 IMPLANT
PAD ARMBOARD 7.5X6 YLW CONV (MISCELLANEOUS) ×6 IMPLANT
PADDING CAST ABS 4INX4YD NS (CAST SUPPLIES) ×4
PADDING CAST ABS COTTON 4X4 ST (CAST SUPPLIES) ×2 IMPLANT
PADDING CAST COTTON 6X4 STRL (CAST SUPPLIES) ×3 IMPLANT
SET HNDPC FAN SPRY TIP SCT (DISPOSABLE) IMPLANT
SPONGE GAUZE 4X4 12PLY (GAUZE/BANDAGES/DRESSINGS) ×6 IMPLANT
SPONGE LAP 18X18 X RAY DECT (DISPOSABLE) ×6 IMPLANT
STOCKINETTE IMPERVIOUS 9X36 MD (GAUZE/BANDAGES/DRESSINGS) ×3 IMPLANT
SUT ETHILON 2 0 FS 18 (SUTURE) ×9 IMPLANT
SUT ETHILON 2 0 PSLX (SUTURE) ×3 IMPLANT
SUT ETHILON 3 0 PS 1 (SUTURE) ×6 IMPLANT
SUT VIC AB 2-0 CT1 36 (SUTURE) ×3 IMPLANT
SUT VIC AB 2-0 FS1 27 (SUTURE) ×6 IMPLANT
SYR CONTROL 10ML LL (SYRINGE) IMPLANT
TOWEL OR 17X24 6PK STRL BLUE (TOWEL DISPOSABLE) ×3 IMPLANT
TOWEL OR 17X26 10 PK STRL BLUE (TOWEL DISPOSABLE) ×3 IMPLANT
TUBE ANAEROBIC SPECIMEN COL (MISCELLANEOUS) IMPLANT
TUBE CONNECTING 12'X1/4 (SUCTIONS) ×1
TUBE CONNECTING 12X1/4 (SUCTIONS) ×2 IMPLANT
TUBE FEEDING 5FR 15 INCH (TUBING) IMPLANT
TUBING CYSTO DISP (UROLOGICAL SUPPLIES) ×3 IMPLANT
UNDERPAD 30X30 INCONTINENT (UNDERPADS AND DIAPERS) ×6 IMPLANT
WATER STERILE IRR 1000ML POUR (IV SOLUTION) ×3 IMPLANT
YANKAUER SUCT BULB TIP NO VENT (SUCTIONS) ×3 IMPLANT

## 2013-12-27 NOTE — Op Note (Addendum)
   Date of Surgery: 12/27/2013  INDICATIONS: Cory Lowery is a 28 y.o.-year-old male who returns back to the OR for a planned repeat irrigation and debridement and evaluation of his wound ;  The patient and family did consent to the procedure after discussion of the risks and benefits.  PREOPERATIVE DIAGNOSIS: Complicated left lower leg laceration with exposed muscle and fascia  POSTOPERATIVE DIAGNOSIS: Same.  PROCEDURE:  1. Debridement of muscle, subcutaneous tissue, skin 5 cm x 5 cm 2.   SURGEON: N. Glee Arvin, M.D.  ASSIST: none.  ANESTHESIA:  general  IV FLUIDS AND URINE: See anesthesia.  ESTIMATED BLOOD LOSS: minimal mL.  IMPLANTS: none  DRAINS: wound vac  COMPLICATIONS: None.  DESCRIPTION OF PROCEDURE: The patient was brought to the operating room and placed supine on the operating table.  The patient had been signed prior to the procedure and this was documented. The patient had the anesthesia placed by the anesthesiologist.  A time-out was performed to confirm that this was the correct patient, site, side and location. The patient had an SCD on the opposite lower extremity. The patient did receive antibiotics prior to the incision and was re-dosed during the procedure as needed at indicated intervals.  The patient had the operative extremity prepped and draped in the standard surgical fashion.    The wound appeared to be healthy and viable.  There was no evidence of additional necrosis.  However, there was not sufficient skin to cover the wound.  Sharp excisional debridement of muscle, subcutaneous tissue was performed using a rongeur.  There was scant amount of gross contamination embedded in the muscle that was debrided.  The muscle contracted with bovie stimulation.  I irrigated 6 liters of normal saline through the wound.  I was able to put another suture in the wound to close it up some more.  The rest of the wound was closed with a wound vac.  He tolerated the  procedure well and was extubated and transferred to the PACU in stable condition.    POSTOPERATIVE PLAN: Patient will return back to the OR on Sunday for likely skin grafting but will attempt primary closure.  [name], MD [facility] [phone] 11:47 AM

## 2013-12-27 NOTE — H&P (Signed)
H&P update  The surgical history has been reviewed and remains accurate without interval change.  The patient was re-examined and patient's physiologic condition has not changed significantly in the last 30 days. The condition still exists that makes this procedure necessary. The treatment plan remains the same, without new options for care.  No new pharmacological allergies or types of therapy has been initiated that would change the plan or the appropriateness of the plan.  The patient and/or family understand the potential benefits and risks.  [name], MD 12/27/2013 7:10 AM

## 2013-12-27 NOTE — Anesthesia Preprocedure Evaluation (Signed)
Anesthesia Evaluation  Patient identified by MRN, date of birth, ID band Patient awake    Reviewed: Allergy & Precautions, H&P , NPO status , Patient's Chart, lab work & pertinent test results  Airway Mallampati: II  Neck ROM: full    Dental   Pulmonary neg pulmonary ROS,          Cardiovascular hypertension,     Neuro/Psych    GI/Hepatic   Endo/Other    Renal/GU      Musculoskeletal   Abdominal   Peds  Hematology   Anesthesia Other Findings   Reproductive/Obstetrics                           Anesthesia Physical Anesthesia Plan  ASA: II  Anesthesia Plan: General   Post-op Pain Management:    Induction: Intravenous  Airway Management Planned: LMA  Additional Equipment:   Intra-op Plan:   Post-operative Plan:   Informed Consent: I have reviewed the patients History and Physical, chart, labs and discussed the procedure including the risks, benefits and alternatives for the proposed anesthesia with the patient or authorized representative who has indicated his/her understanding and acceptance.     Plan Discussed with: CRNA, Anesthesiologist and Surgeon  Anesthesia Plan Comments:         Anesthesia Quick Evaluation  

## 2013-12-27 NOTE — Transfer of Care (Signed)
Immediate Anesthesia Transfer of Care Note  Patient: Cory Lowery  Procedure(s) Performed: Procedure(s): IRRIGATION AND DEBRIDEMENT RIGHT LOWER LEG, POSSIBLE CLOSURE, POSSIBLE VAC (Left)  Patient Location: PACU  Anesthesia Type:General  Level of Consciousness: sedated, patient cooperative and responds to stimulation  Airway & Oxygen Therapy: Patient Spontanous Breathing and Patient connected to nasal cannula oxygen  Post-op Assessment: Report given to PACU RN, Post -op Vital signs reviewed and stable and Patient moving all extremities X 4  Post vital signs: Reviewed and stable  Complications: No apparent anesthesia complications

## 2013-12-27 NOTE — Progress Notes (Signed)
PT Cancellation Note  Patient Details Name: Cory Lowery MRN: [id number] DOB: 1985/11/05   Cancelled Treatment:    Reason Eval/Treat Not Completed: Medical issues which prohibited therapy. Patient scheduled for surgery today. Possible closure. Will follow up tomorrow.    Adline Potter Berneta Sconyers 12/27/2013, 7:42 AM

## 2013-12-27 NOTE — Anesthesia Procedure Notes (Signed)
Procedure Name: LMA Insertion Performed by: Melina Schools Pre-anesthesia Checklist: Patient identified, Emergency Drugs available, Suction available and Patient being monitored Patient Re-evaluated:Patient Re-evaluated prior to inductionOxygen Delivery Method: Circle system utilized Preoxygenation: Pre-oxygenation with 100% oxygen Intubation Type: IV induction Ventilation: Mask ventilation without difficulty LMA: LMA inserted LMA Size: 4.0 Number of attempts: 1 Tube secured with: Tape Dental Injury: Teeth and Oropharynx as per pre-operative assessment

## 2013-12-27 NOTE — Care Management Note (Signed)
CARE MANAGEMENT NOTE 12/27/2013  Patient:  [facility]   Account Number:  [id number]  Date Initiated:  12/27/2013  Documentation initiated by:  Vance Peper  Subjective/Objective Assessment:   28 yr old male s/p I & D of left leg wound with VAC.     Action/Plan:   Case manager spoke with patient along with interpreter.Patient injured at work but they do not have worker's comp.His employer will discuss plan with him at a later time. Patient to return to OR 12/27/13.Wound Vac application on chart   Anticipated DC Date:     Anticipated DC Plan:        DC Planning Services  CM consult      Choice offered to / List presented to:             Status of service:  In process, will continue to follow Medicare Important Message given?

## 2013-12-28 NOTE — Progress Notes (Signed)
Physical Therapy Treatment Patient Details Name: Cory Lowery MRN: [id number] DOB: 1986-02-15 Today's Date: 2014/01/17    History of Present Illness admitted 12/25/13 after forklift accident and sustained complicated laceration to lower leg, s/p I and D and application of wound VAC.    PT Comments    Pt progressing nicely with mobility.  Will practice stairs with pt prior to DC.  Follow Up Recommendations  No PT follow up     Equipment Recommendations  Crutches    Recommendations for Other Services       Precautions / Restrictions Precautions Precautions: Fall Required Braces or Orthoses: Other Brace/Splint Other Brace/Splint: CAM boot Restrictions Weight Bearing Restrictions: Yes LLE Weight Bearing: Weight bearing as tolerated    Mobility  Bed Mobility Overal bed mobility: Independent                Transfers Overall transfer level: Needs assistance Equipment used: Ambulation equipment used Transfers: Sit to/from Stand Sit to Stand: Supervision         General transfer comment: cues for technique  Ambulation/Gait Ambulation/Gait assistance: Supervision Ambulation Distance (Feet): 150 Feet Assistive device: Crutches Gait Pattern/deviations: Step-to pattern;Decreased stride length Gait velocity: decreased   General Gait Details: cues to not put crutches too close together   Stairs            Wheelchair Mobility    Modified Rankin (Stroke Patients Only)       Balance                                    Cognition Arousal/Alertness: Awake/alert Behavior During Therapy: WFL for tasks assessed/performed Overall Cognitive Status: Within Functional Limits for tasks assessed                      Exercises      General Comments General comments (skin integrity, edema, etc.): family member present at end of session and translated a few questions for pt.  Pt generally able to understand most English.  All  questions answered.       Pertinent Vitals/Pain 6/10 pain in LLE    Home Living                      Prior Function            PT Goals (current goals can now be found in the care plan section) Acute Rehab PT Goals Patient Stated Goal: pt states he prefers crutches to RW Progress towards PT goals: Progressing toward goals    Frequency       PT Plan Current plan remains appropriate;Equipment recommendations need to be updated    Co-evaluation             End of Session Equipment Utilized During Treatment: Gait belt;Other (comment) (Cam boot, VAC to LLE) Activity Tolerance: Patient tolerated treatment well Patient left: in bed;with call bell/phone within reach;with family/visitor present     Time: 0626-9485 PT Time Calculation (min): 25 min  Charges:  $Gait Training: 23-37 mins                    G CodesDonnella Sham Jan 17, 2014, 1:02 PM Lavona Mound, PT  [phone] 2014/01/17

## 2013-12-28 NOTE — ED Provider Notes (Signed)
Medical screening examination/treatment/procedure(s) were conducted as a shared visit with non-physician practitioner(s) and myself.  I personally evaluated the patient during the encounter.   EKG Interpretation None      Patient evaluated face-to-face. Radiographs reviewed. No fracture noted. Exam shows a large soft tissue defect and laceration of the mid lateral left lower leg. He has paresthesias to the dorsum of the foot consistent with superficial peroneal nerve injury. He has intact dorsiflexion of the foot and toes. No motor deficits. Care discussed with orthopedics by PA. Plan will be to the OR for exploration and repair.  Rolland Porter, MD 12/28/13 1010

## 2013-12-28 NOTE — Progress Notes (Signed)
Subjective: 1 Day Post-Op Procedure(s) (LRB): IRRIGATION AND DEBRIDEMENT RIGHT LOWER LEG, POSSIBLE CLOSURE, POSSIBLE VAC (Left) Patient reports pain as mild.    Objective: Vital signs in last 24 hours: Temp:  [97.6 F (36.4 C)-98 F (36.7 C)] 97.7 F (36.5 C) (05/23 0438) Pulse Rate:  [58-92] 68 (05/23 0438) Resp:  [10-18] 16 (05/23 0438) BP: (101-139)/(60-88) 101/60 mmHg (05/23 0438) SpO2:  [96 %-100 %] 100 % (05/23 0438)  Intake/Output from previous day: 05/22 0701 - 05/23 0700 In: 1090 [P.O.:240; I.V.:850] Out: 2450 [Urine:2450] Intake/Output this shift: Total I/O In: 360 [P.O.:360] Out: 350 [Urine:350]   Recent Labs  12/25/13 1610  HGB 14.2    Recent Labs  12/25/13 1610  WBC 9.7  RBC 4.46  HCT 41.0  PLT 308    Recent Labs  12/25/13 1610  NA 140  K 3.9  CL 101  CO2 23  BUN 25*  CREATININE 1.03  GLUCOSE 126*  CALCIUM 9.0    Recent Labs  12/25/13 1610  INR 1.02    VAC seal good.   Assessment/Plan: 1 Day Post-Op Procedure(s) (LRB): IRRIGATION AND DEBRIDEMENT RIGHT LOWER LEG, POSSIBLE CLOSURE, POSSIBLE VAC (Left) Plan:    Per Dr Donnajean Lopes out and skin grafting either Sunday or Monday.   Cory Lowery 12/28/2013, 11:09 AM

## 2013-12-29 ENCOUNTER — Encounter (HOSPITAL_COMMUNITY): Payer: Self-pay | Admitting: Anesthesiology

## 2013-12-29 ENCOUNTER — Inpatient Hospital Stay (HOSPITAL_COMMUNITY): Payer: Self-pay | Admitting: Anesthesiology

## 2013-12-29 ENCOUNTER — Encounter (HOSPITAL_COMMUNITY)
Admission: EM | Disposition: A | Discharge status: Home / Self Care | Payer: Self-pay | Source: Home / Self Care | Attending: Orthopaedic Surgery

## 2013-12-29 HISTORY — PX: I & D EXTREMITY: SHX5045

## 2013-12-29 SURGERY — IRRIGATION AND DEBRIDEMENT EXTREMITY
Anesthesia: General | Laterality: Left

## 2013-12-29 MED ORDER — ONDANSETRON HCL 4 MG PO TABS: 4.0000 mg | ORAL_TABLET | Freq: Four times a day (QID) | ORAL | Status: DC | PRN

## 2013-12-29 MED ORDER — OXYCODONE HCL 5 MG PO TABS
5.0000 mg | ORAL_TABLET | ORAL | Status: DC | PRN
  Administered 2013-12-30 – 2013-12-31 (×4): 15 mg via ORAL
  Filled 2013-12-29: qty 3

## 2013-12-29 MED ORDER — CHLORHEXIDINE GLUCONATE 4 % EX LIQD
60.0000 mL | Freq: Once | CUTANEOUS | Status: DC
  Filled 2013-12-29: qty 60

## 2013-12-29 MED ORDER — FENTANYL CITRATE 0.05 MG/ML IJ SOLN
INTRAMUSCULAR | Status: AC
  Filled 2013-12-29: qty 5

## 2013-12-29 MED ORDER — HYDROMORPHONE HCL PF 1 MG/ML IJ SOLN: 0.2500 mg | INTRAMUSCULAR | Status: DC | PRN

## 2013-12-29 MED ORDER — SODIUM CHLORIDE 0.9 % IV SOLN: INTRAVENOUS | Status: DC

## 2013-12-29 MED ORDER — MINERAL OIL LIGHT 100 % EX OIL
TOPICAL_OIL | CUTANEOUS | Status: DC | PRN
  Administered 2013-12-29: 1 via TOPICAL

## 2013-12-29 MED ORDER — MORPHINE SULFATE 2 MG/ML IJ SOLN: 1.0000 mg | INTRAMUSCULAR | Status: DC | PRN

## 2013-12-29 MED ORDER — SODIUM CHLORIDE 0.9 % IR SOLN
Status: DC | PRN
  Administered 2013-12-29: 1000 mL

## 2013-12-29 MED ORDER — HYDROCODONE-ACETAMINOPHEN 5-325 MG PO TABS: 1.0000 | ORAL_TABLET | ORAL | Status: DC | PRN

## 2013-12-29 MED ORDER — METOCLOPRAMIDE HCL 5 MG/ML IJ SOLN: 5.0000 mg | Freq: Three times a day (TID) | INTRAMUSCULAR | Status: DC | PRN

## 2013-12-29 MED ORDER — LIDOCAINE HCL (CARDIAC) 20 MG/ML IV SOLN
INTRAVENOUS | Status: DC | PRN
  Administered 2013-12-29: 100 mg via INTRAVENOUS

## 2013-12-29 MED ORDER — PROPOFOL 10 MG/ML IV BOLUS
INTRAVENOUS | Status: AC
  Filled 2013-12-29: qty 20

## 2013-12-29 MED ORDER — LIDOCAINE HCL (CARDIAC) 20 MG/ML IV SOLN
INTRAVENOUS | Status: AC
  Filled 2013-12-29: qty 5

## 2013-12-29 MED ORDER — ONDANSETRON HCL 4 MG/2ML IJ SOLN: 4.0000 mg | Freq: Once | INTRAMUSCULAR | Status: DC | PRN

## 2013-12-29 MED ORDER — MINERAL OIL LIGHT 100 % EX OIL
TOPICAL_OIL | CUTANEOUS | Status: AC
  Filled 2013-12-29: qty 25

## 2013-12-29 MED ORDER — MIDAZOLAM HCL 2 MG/2ML IJ SOLN
INTRAMUSCULAR | Status: AC
  Filled 2013-12-29: qty 2

## 2013-12-29 MED ORDER — SODIUM CHLORIDE 0.9 % IR SOLN
Status: DC | PRN
  Administered 2013-12-29: 08:00:00

## 2013-12-29 MED ORDER — FENTANYL CITRATE 0.05 MG/ML IJ SOLN
INTRAMUSCULAR | Status: DC | PRN
  Administered 2013-12-29 (×3): 25 ug via INTRAVENOUS
  Administered 2013-12-29 (×2): 50 ug via INTRAVENOUS
  Administered 2013-12-29: 25 ug via INTRAVENOUS
  Administered 2013-12-29: 50 ug via INTRAVENOUS

## 2013-12-29 MED ORDER — ONDANSETRON HCL 4 MG/2ML IJ SOLN: 4.0000 mg | Freq: Four times a day (QID) | INTRAMUSCULAR | Status: DC | PRN

## 2013-12-29 MED ORDER — HYDROCODONE-ACETAMINOPHEN 5-325 MG PO TABS: 1.0000 | ORAL_TABLET | Freq: Four times a day (QID) | ORAL | Status: DC | PRN

## 2013-12-29 MED ORDER — CEFAZOLIN SODIUM-DEXTROSE 2-3 GM-% IV SOLR
2.0000 g | INTRAVENOUS | Status: AC
  Administered 2013-12-29: 2 g via INTRAVENOUS
  Filled 2013-12-29: qty 50

## 2013-12-29 MED ORDER — DIPHENHYDRAMINE HCL 12.5 MG/5ML PO ELIX
25.0000 mg | ORAL_SOLUTION | ORAL | Status: DC | PRN
  Administered 2013-12-31: 25 mg via ORAL

## 2013-12-29 MED ORDER — SODIUM CHLORIDE 0.9 % IV SOLN
INTRAVENOUS | Status: DC
  Administered 2013-12-29: 14:00:00 via INTRAVENOUS

## 2013-12-29 MED ORDER — CEFAZOLIN SODIUM-DEXTROSE 2-3 GM-% IV SOLR
2.0000 g | Freq: Four times a day (QID) | INTRAVENOUS | Status: AC
  Administered 2013-12-29 – 2013-12-30 (×3): 2 g via INTRAVENOUS
  Filled 2013-12-29 (×3): qty 50

## 2013-12-29 MED ORDER — PROPOFOL 10 MG/ML IV BOLUS
INTRAVENOUS | Status: DC | PRN
  Administered 2013-12-29: 200 mg via INTRAVENOUS

## 2013-12-29 MED ORDER — MIDAZOLAM HCL 2 MG/2ML IJ SOLN
INTRAMUSCULAR | Status: DC | PRN
  Administered 2013-12-29: 2 mg via INTRAVENOUS

## 2013-12-29 MED ORDER — EPINEPHRINE HCL 1 MG/ML IJ SOLN
INTRAMUSCULAR | Status: AC
  Filled 2013-12-29: qty 1

## 2013-12-29 MED ORDER — METOCLOPRAMIDE HCL 5 MG PO TABS
5.0000 mg | ORAL_TABLET | Freq: Three times a day (TID) | ORAL | Status: DC | PRN
  Filled 2013-12-29: qty 2

## 2013-12-29 SURGICAL SUPPLY — 69 items
BANDAGE CONFORM 3  STR LF (GAUZE/BANDAGES/DRESSINGS) IMPLANT
BANDAGE ELASTIC 3 VELCRO ST LF (GAUZE/BANDAGES/DRESSINGS) IMPLANT
BLADE DERMATOME II (BLADE) ×3 IMPLANT
BLADE SURG 10 STRL SS (BLADE) ×3 IMPLANT
BNDG COHESIVE 1X5 TAN STRL LF (GAUZE/BANDAGES/DRESSINGS) IMPLANT
BNDG COHESIVE 4X5 TAN STRL (GAUZE/BANDAGES/DRESSINGS) ×3 IMPLANT
BNDG COHESIVE 6X5 TAN STRL LF (GAUZE/BANDAGES/DRESSINGS) ×9 IMPLANT
BNDG GAUZE ELAST 4 BULKY (GAUZE/BANDAGES/DRESSINGS) ×3 IMPLANT
BNDG GAUZE STRTCH 6 (GAUZE/BANDAGES/DRESSINGS) ×9 IMPLANT
CORDS BIPOLAR (ELECTRODE) IMPLANT
COVER SURGICAL LIGHT HANDLE (MISCELLANEOUS) ×3 IMPLANT
CUFF TOURNIQUET SINGLE 24IN (TOURNIQUET CUFF) IMPLANT
CUFF TOURNIQUET SINGLE 34IN LL (TOURNIQUET CUFF) ×6 IMPLANT
CUFF TOURNIQUET SINGLE 44IN (TOURNIQUET CUFF) IMPLANT
DEPRESSOR TONGUE BLADE STERILE (MISCELLANEOUS) ×3 IMPLANT
DERMACARRIERS GRAFT 1 TO 1.5 (DISPOSABLE) ×3
DRAPE EXTREMITY BILATERAL (DRAPE) IMPLANT
DRAPE IMP U-DRAPE 54X76 (DRAPES) IMPLANT
DRAPE INCISE IOBAN 66X45 STRL (DRAPES) ×12 IMPLANT
DRAPE SURG 17X23 STRL (DRAPES) IMPLANT
DRAPE U-SHAPE 47X51 STRL (DRAPES) ×3 IMPLANT
DRSG ADAPTIC 3X8 NADH LF (GAUZE/BANDAGES/DRESSINGS) ×3 IMPLANT
DURAPREP 26ML APPLICATOR (WOUND CARE) ×3 IMPLANT
ELECT CAUTERY BLADE 6.4 (BLADE) ×3 IMPLANT
ELECT REM PT RETURN 9FT ADLT (ELECTROSURGICAL)
ELECTRODE REM PT RTRN 9FT ADLT (ELECTROSURGICAL) IMPLANT
FACESHIELD WRAPAROUND (MASK) IMPLANT
GAUZE XEROFORM 1X8 LF (GAUZE/BANDAGES/DRESSINGS) ×3 IMPLANT
GAUZE XEROFORM 5X9 LF (GAUZE/BANDAGES/DRESSINGS) ×6 IMPLANT
GLOVE SURG SS PI 7.5 STRL IVOR (GLOVE) ×6 IMPLANT
GOWN STRL REUS W/ TWL LRG LVL3 (GOWN DISPOSABLE) ×1 IMPLANT
GOWN STRL REUS W/ TWL XL LVL3 (GOWN DISPOSABLE) ×1 IMPLANT
GOWN STRL REUS W/TWL LRG LVL3 (GOWN DISPOSABLE) ×2
GOWN STRL REUS W/TWL XL LVL3 (GOWN DISPOSABLE) ×2
GRAFT DERMACARRIERS 1 TO 1.5 (DISPOSABLE) ×1 IMPLANT
HANDPIECE INTERPULSE COAX TIP (DISPOSABLE)
KIT BASIN OR (CUSTOM PROCEDURE TRAY) ×3 IMPLANT
KIT ROOM TURNOVER OR (KITS) ×3 IMPLANT
MANIFOLD NEPTUNE II (INSTRUMENTS) ×3 IMPLANT
NS IRRIG 1000ML POUR BTL (IV SOLUTION) ×6 IMPLANT
PACK ORTHO EXTREMITY (CUSTOM PROCEDURE TRAY) ×3 IMPLANT
PAD ABD 8X10 STRL (GAUZE/BANDAGES/DRESSINGS) ×3 IMPLANT
PAD ARMBOARD 7.5X6 YLW CONV (MISCELLANEOUS) ×6 IMPLANT
PAD NEG PRESSURE SENSATRAC (MISCELLANEOUS) ×3 IMPLANT
PADDING CAST ABS 4INX4YD NS (CAST SUPPLIES) ×4
PADDING CAST ABS COTTON 4X4 ST (CAST SUPPLIES) ×2 IMPLANT
PADDING CAST COTTON 6X4 STRL (CAST SUPPLIES) ×3 IMPLANT
SET HNDPC FAN SPRY TIP SCT (DISPOSABLE) IMPLANT
SET IRRIG Y TYPE TUR BLADDER L (SET/KITS/TRAYS/PACK) ×3 IMPLANT
SPONGE GAUZE 4X4 12PLY (GAUZE/BANDAGES/DRESSINGS) ×6 IMPLANT
SPONGE LAP 18X18 X RAY DECT (DISPOSABLE) ×6 IMPLANT
STOCKINETTE IMPERVIOUS 9X36 MD (GAUZE/BANDAGES/DRESSINGS) ×3 IMPLANT
SUT ETHILON 2 0 FS 18 (SUTURE) ×9 IMPLANT
SUT ETHILON 2 0 PSLX (SUTURE) ×6 IMPLANT
SUT ETHILON 3 0 PS 1 (SUTURE) ×6 IMPLANT
SUT MNCRL AB 4-0 PS2 18 (SUTURE) ×3 IMPLANT
SUT VIC AB 2-0 CT1 36 (SUTURE) ×3 IMPLANT
SUT VIC AB 2-0 FS1 27 (SUTURE) ×6 IMPLANT
SYR CONTROL 10ML LL (SYRINGE) IMPLANT
TOWEL OR 17X24 6PK STRL BLUE (TOWEL DISPOSABLE) ×3 IMPLANT
TOWEL OR 17X26 10 PK STRL BLUE (TOWEL DISPOSABLE) ×3 IMPLANT
TUBE ANAEROBIC SPECIMEN COL (MISCELLANEOUS) IMPLANT
TUBE CONNECTING 12'X1/4 (SUCTIONS) ×1
TUBE CONNECTING 12X1/4 (SUCTIONS) ×2 IMPLANT
TUBE FEEDING 5FR 15 INCH (TUBING) IMPLANT
TUBING CYSTO DISP (UROLOGICAL SUPPLIES) ×3 IMPLANT
UNDERPAD 30X30 INCONTINENT (UNDERPADS AND DIAPERS) ×6 IMPLANT
WATER STERILE IRR 1000ML POUR (IV SOLUTION) ×3 IMPLANT
YANKAUER SUCT BULB TIP NO VENT (SUCTIONS) ×3 IMPLANT

## 2013-12-29 NOTE — Discharge Instructions (Signed)
1. Change thigh dressing once a day with xeroform and dry dressing 2. Do not change wound vac on leg 3. Follow up in office on Friday 5/29 vs. Monday 6/1

## 2013-12-29 NOTE — Anesthesia Procedure Notes (Signed)
Procedure Name: LMA Insertion Date/Time: 12/29/2013 7:46 AM Performed by: Alanda Amass A Pre-anesthesia Checklist: Patient identified, Timeout performed, Emergency Drugs available, Suction available and Patient being monitored Patient Re-evaluated:Patient Re-evaluated prior to inductionOxygen Delivery Method: Circle system utilized Preoxygenation: Pre-oxygenation with 100% oxygen Intubation Type: IV induction LMA Size: 5.0 Number of attempts: 1 Placement Confirmation: breath sounds checked- equal and bilateral and positive ETCO2 Tube secured with: Tape Dental Injury: Teeth and Oropharynx as per pre-operative assessment

## 2013-12-29 NOTE — Anesthesia Preprocedure Evaluation (Addendum)
Anesthesia Evaluation  Patient identified by MRN, date of birth, ID band Patient awake    Reviewed: Allergy & Precautions, H&P , NPO status , Patient's Chart, lab work & pertinent test results  Airway Mallampati: I TM Distance: >3 FB Neck ROM: Full    Dental  (+) Teeth Intact, Dental Advisory Given   Pulmonary          Cardiovascular hypertension,     Neuro/Psych    GI/Hepatic   Endo/Other    Renal/GU      Musculoskeletal   Abdominal   Peds  Hematology   Anesthesia Other Findings   Reproductive/Obstetrics                        Anesthesia Physical Anesthesia Plan  ASA: II  Anesthesia Plan: General   Post-op Pain Management:    Induction: Intravenous  Airway Management Planned: LMA  Additional Equipment:   Intra-op Plan:   Post-operative Plan: Extubation in OR  Informed Consent: I have reviewed the patients History and Physical, chart, labs and discussed the procedure including the risks, benefits and alternatives for the proposed anesthesia with the patient or authorized representative who has indicated his/her understanding and acceptance.     Plan Discussed with:   Anesthesia Plan Comments:         Anesthesia Quick Evaluation

## 2013-12-29 NOTE — H&P (Signed)
H&P update  The surgical history has been reviewed and remains accurate without interval change.  The patient was re-examined and patient's physiologic condition has not changed significantly in the last 30 days. The condition still exists that makes this procedure necessary. The treatment plan remains the same, without new options for care.  No new pharmacological allergies or types of therapy has been initiated that would change the plan or the appropriateness of the plan.  The patient and/or family understand the potential benefits and risks.  [name], MD 12/29/2013 7:12 AM

## 2013-12-29 NOTE — Anesthesia Postprocedure Evaluation (Signed)
  Anesthesia Post-op Note  Patient: Cory Lowery  Procedure(s) Performed: Procedure(s): IRRIGATION AND DEBRIDEMENT LEFT LOWER LEG, SPLIT THICKNESS SKIN GRAFT (Left)  Patient Location: PACU  Anesthesia Type:General  Level of Consciousness: awake, alert , oriented and patient cooperative  Airway and Oxygen Therapy: Patient Spontanous Breathing  Post-op Pain: mild  Post-op Assessment: Post-op Vital signs reviewed, Patient's Cardiovascular Status Stable, Respiratory Function Stable, Patent Airway and No signs of Nausea or vomiting  Post-op Vital Signs: stable  Last Vitals:  Filed Vitals:   12/29/13 0932  BP: 129/83  Pulse: 63  Temp: 36.4 C  Resp: 14    Complications: No apparent anesthesia complications

## 2013-12-29 NOTE — Op Note (Signed)
   Date of Surgery: 12/29/2013  INDICATIONS: Cory Lowery is a 28 y.o.-year-old male who returns back today for a repeat irrigation and debridement and skin grafting ;  The patient and family did consent to the procedure after discussion of the risks and benefits.  PREOPERATIVE DIAGNOSIS: Complicated left lower leg laceration with skin loss  POSTOPERATIVE DIAGNOSIS: Same.  PROCEDURE:  1. Surgical preparation of recipient skin graft site left lower leg measuring 3 cm x 2 cm 2. Split thickness skin grafting of left lower leg measuring 3 cm x 2 cm 3. Application of negative wound therapy less than 50 cm  SURGEON: N. Glee Arvin, M.D.  ASSIST: none.  ANESTHESIA:  general  IV FLUIDS AND URINE: See anesthesia.  ESTIMATED BLOOD LOSS: minimal mL.  IMPLANTS: none  DRAINS: wound vac  COMPLICATIONS: None.  DESCRIPTION OF PROCEDURE: The patient was brought to the operating room and placed supine on the operating table.  The patient had been signed prior to the procedure and this was documented. The patient had the anesthesia placed by the anesthesiologist.  A time-out was performed to confirm that this was the correct patient, site, side and location. The patient had an SCD on the opposite lower extremity. The patient did receive antibiotics prior to the incision and was re-dosed during the procedure as needed at indicated intervals.  The patient had the operative extremity prepped and draped in the standard surgical fashion.    I started with the surgical preparation of the recipient site. I sharply debrided any fibrinous material that was in the wound with a knife. The rest of the wound was inspected and appeared to be healthy and viable with red beefy tissue. I then took a split thickness skin graft measuring 07/999 of an inch with a dermatome from the left thigh. An epinephrine-soaked Telfa was placed on the donor site for hemostasis. The skin graft was pie crusted and placed on the  wound. The skin graft was secured down with a running 4-0 Monocryl. Adaptic was then placed on the skin graft and a wound VAC was placed on top of the Adaptic. The pressure was set to -75 mmHg.  A Xeroform and gauze dressing was placed on the donor site on the left side. The patient tolerated the procedure well. He was extubated and transferred to the PACU in stable condition  POSTOPERATIVE PLAN: The patient will be weight bear as tolerated in a fracture boot. He is to keep the wound VAC on the skin graft for 5-7 days. He will return back to the office to have this removed and have the skin graft inspected for acceptance. The donor site will need daily dressing changes with Xeroform and gauze. The patient will need to go home with a home VAC. We will have case management help [place] with this.  [name], MD [facility] [phone] 8:43 AM

## 2013-12-29 NOTE — Transfer of Care (Signed)
Immediate Anesthesia Transfer of Care Note  Patient: Cory Lowery  Procedure(s) Performed: Procedure(s): IRRIGATION AND DEBRIDEMENT LEFT LOWER LEG, SPLIT THICKNESS SKIN GRAFT (Left)  Patient Location: PACU  Anesthesia Type:general  Level of Consciousness: awake  Airway & Oxygen Therapy: Patient Spontanous Breathing  Post-op Assessment: Report given to PACU RN and Post -op Vital signs reviewed and stable  Post vital signs: Reviewed and stable  Complications: No apparent anesthesia complications

## 2013-12-29 NOTE — Progress Notes (Signed)
Orthopedic Tech Progress Note Patient Details:  Cory Lowery 1985-10-08 [id number] Patient already had OHF Patient ID: Cory Lowery, male   DOB: 04-01-1986, 28 y.o.   MRN: [id number]   Cory Lowery 12/29/2013, 11:59 AM

## 2013-12-30 NOTE — Progress Notes (Signed)
Physical Therapy Treatment Patient Details Name: Cory Lowery MRN: [id number] DOB: [birth year] Today's Date: 12/30/2013    History of Present Illness admitted 12/25/13 after forklift accident and sustained complicated laceration to lower leg, s/p I and D and application of wound VAC.    PT Comments    Patient able to complete stair training this session. Overall moving well and safe.   Follow Up Recommendations  No PT follow up     Equipment Recommendations  Crutches    Recommendations for Other Services       Precautions / Restrictions Precautions Precautions: Fall Required Braces or Orthoses: Other Brace/Splint Other Brace/Splint: CAM boot Restrictions LLE Weight Bearing: Weight bearing as tolerated    Mobility  Bed Mobility Overal bed mobility: Independent                Transfers Overall transfer level: Modified independent                  Ambulation/Gait Ambulation/Gait assistance: Supervision Ambulation Distance (Feet): 150 Feet Assistive device: Crutches       General Gait Details: Cues to bring crutches closer to body and not too far out.    Stairs Stairs: Yes Stairs assistance: Min guard Stair Management: Step to pattern;Forwards;With crutches;Two rails Number of Stairs: 5 General stair comments: Visual and verbal cues for seqency and technique  Wheelchair Mobility    Modified Rankin (Stroke Patients Only)       Balance                                    Cognition Arousal/Alertness: Awake/alert Behavior During Therapy: WFL for tasks assessed/performed Overall Cognitive Status: Within Functional Limits for tasks assessed                      Exercises      General Comments        Pertinent Vitals/Pain no apparent distress     Home Living                      Prior Function            PT Goals (current goals can now be found in the care plan section) Progress towards  PT goals: Progressing toward goals    Frequency  Min 6X/week    PT Plan Current plan remains appropriate    Co-evaluation             End of Session Equipment Utilized During Treatment: Gait belt;Other (comment) (VAC) Activity Tolerance: Patient tolerated treatment well Patient left: in chair;with call bell/phone within reach;with family/visitor present     Time: 9357-0177 PT Time Calculation (min): 24 min  Charges:  $Gait Training: 23-37 mins                    G Codes:      Adline Potter Robinette 12/30/2013, 12:42 PM 12/30/2013 Adline Potter Robinette PTA [phone] pager [phone] office

## 2013-12-30 NOTE — Progress Notes (Signed)
12/30/13 Spoke with Rickie at [facility] regarding patient discharging on 12/31/13 with [facility] for home. Form for Dr. Roda Shutters to sign placed on chart. Faxed  facesheet, H and P, and OR notes x2 to Rickie at [facility]. CM will continue to follow. Jacquelynn Cree RN, BSN, CCM

## 2013-12-30 NOTE — Progress Notes (Signed)
Subjective: 1 Day Post-Op Procedure(s) (LRB): IRRIGATION AND DEBRIDEMENT LEFT LOWER LEG, SPLIT THICKNESS SKIN GRAFT (Left) Patient reports pain as mild.    Objective: Vital signs in last 24 hours: Temp:  [97.8 F (36.6 C)-98.4 F (36.9 C)] 97.8 F (36.6 C) (05/25 0641) Pulse Rate:  [82-86] 82 (05/25 0641) Resp:  [14-17] 17 (05/25 0641) BP: (109-154)/(57-83) 112/62 mmHg (05/25 0647) SpO2:  [98 %-100 %] 100 % (05/25 0641)  Intake/Output from previous day: 05/24 0701 - 05/25 0700 In: 1780 [P.O.:840; I.V.:940] Out: 855 [Urine:850; Blood:5] Intake/Output this shift: Total I/O In: 360 [P.O.:360] Out: -   No results found for this basename: HGB,  in the last 72 hours No results found for this basename: WBC, RBC, HCT, PLT,  in the last 72 hours No results found for this basename: NA, K, CL, CO2, BUN, CREATININE, GLUCOSE, CALCIUM,  in the last 72 hours No results found for this basename: LABPT, INR,  in the last 72 hours  Pt asleep on rounds-not disturbed. Checked with nurse who related that pt is doing well without problems. Case manager is working on obtaining home use wound vac  Assessment/Plan: 1 Day Post-Op Procedure(s) (LRB): IRRIGATION AND DEBRIDEMENT LEFT LOWER LEG, SPLIT THICKNESS SKIN GRAFT (Left) Plan for discharge tomorrow with wound vac  Cory Lowery 12/30/2013, 12:44 PM

## 2013-12-31 ENCOUNTER — Encounter (HOSPITAL_COMMUNITY): Payer: Self-pay | Admitting: Orthopaedic Surgery

## 2013-12-31 NOTE — Care Management Note (Signed)
CARE MANAGEMENT NOTE 12/31/2013  Patient:  [facility]   Account Number:  [id number]  Date Initiated:  12/27/2013  Documentation initiated by:  Vance Peper  Subjective/Objective Assessment:   28 yr old male s/p I & D of left leg wound with VAC.     Action/Plan:   Case manager spoke with patient along with interpreter.Patient injured at work but they do not have worker's comp.His employer will discuss plan with him at a later time. Patient to return to OR 12/27/13.Wound Vac application on chart   Anticipated DC Date:  12/31/2013   Anticipated DC Plan:  HOME W HOME HEALTH SERVICES      DC Planning Services  CM consult      Choice offered to / List presented to:     DME arranged  VAC      DME agency  [facility]     HH arranged  HH-1 RN      [facility] agency  [facility]   Status of service:  Completed, signed off Medicare Important Message given?   (If response is "NO", the following Medicare IM given date fields will be blank) Date Medicare IM given:   Date Additional Medicare IM given:    Discharge Disposition:  HOME W HOME HEALTH SERVICES  Per UR Regulation:    If discussed at Long Length of Stay Meetings, dates discussed:    Comments:  12/31/13 1230 Vance Peper, RN BSN Case Manager Wound VAC has been released from [facility], patient will be discharged once applied.  12/31/13 Vance Peper, RN BSN Case Manager CM faxed charity letter and MD script to Rickie at [facility] [phone], will await [facility] authorization and delivery time.

## 2013-12-31 NOTE — Progress Notes (Signed)
   Subjective:  Patient reports pain as mild.    Objective:   VITALS:   Filed Vitals:   12/30/13 0647 12/30/13 1353 12/30/13 2213 12/31/13 0700  BP: 112/62 140/81 133/89 110/66  Pulse:  101 86 77  Temp:  98 F (36.7 C) 98.1 F (36.7 C) 98 F (36.7 C)  TempSrc:   Oral Oral  Resp:  14 16 16   Height:      Weight:      SpO2:  98% 99% 100%    Neurologically intact Neurovascular intact Sensation intact distally Intact pulses distally Dorsiflexion/Plantar flexion intact No cellulitis present Compartment soft VAC with good seal and suction  Lab Results  Component Value Date   WBC 9.7 12/25/2013   HGB 14.2 12/25/2013   HCT 41.0 12/25/2013   MCV 91.9 12/25/2013   PLT 308 12/25/2013     Assessment/Plan:  2 Days Post-Op   - home VAC app signed in chart - DVT ppx - SCDs, ambulation, asa - WBAT left lower extremity - Discharge planning - dc home once home vac is approved - f/u in office Monday 01/06/14 for vac removal - daily dressing changes to left thigh donor site  Problem List Items Addressed This Visit     Other   *Laceration of lower leg, left, complicated   Relevant Orders      Weight bearing as tolerated      Call MD / Call 911      Diet - low sodium heart healthy      Constipation Prevention      Diet general      Increase activity slowly as tolerated      Driving restrictions   Laceration of lower leg, complicated   Relevant Orders      Weight bearing as tolerated      Call MD / Call 911      Diet - low sodium heart healthy      Constipation Prevention      Diet general      Increase activity slowly as tolerated      Driving restrictions    Other Visit Diagnoses   Lower leg laceration involving tendon    -  Primary    Relevant Orders       Weight bearing as tolerated       Call MD / Call 911       Diet - low sodium heart healthy       Constipation Prevention       Diet general       Increase activity slowly as tolerated       Driving  restrictions        Naiping Glee Arvin 12/31/2013, 7:52 AM [phone]

## 2013-12-31 NOTE — Discharge Summary (Signed)
Physician Discharge Summary      Patient ID: Renetta ChalkJairo Ramirez-Malpica MRN: [id number] DOB/AGE: 10/15/1985 28 y.o.  Admit date: 12/25/2013 Discharge date: 12/31/2013  Admission Diagnoses:  Laceration of lower leg, left, complicated  Discharge Diagnoses:  Principal Problem:   Laceration of lower leg, left, complicated Active Problems:   Laceration of lower leg, complicated   Past Medical History  Diagnosis Date  . Hypertension     Surgeries: Procedure(s): Multiple irrigation and debridement of left lower leg Split thickness skin grafting   Consultants (if any):    Discharged Condition: Improved  Hospital Course: Renetta ChalkJairo Ramirez-Malpica is an 28 y.o. male who was admitted 12/25/2013 with a diagnosis of Laceration of lower leg, left, complicated and went to the operating room on 12/25/2013 - 12/29/2013 and underwent the above named procedures.  He had an uneventful hospital course.  His pain was well controlled.  He was discharged in stable condition.    He was given perioperative antibiotics:      Anti-infectives   Start     Dose/Rate Route Frequency Ordered Stop   12/29/13 1300  ceFAZolin (ANCEF) IVPB 2 g/50 mL premix     2 g 100 mL/hr over 30 Minutes Intravenous Every 6 hours 12/29/13 0931 12/30/13 0149   12/29/13 0645  ceFAZolin (ANCEF) IVPB 2 g/50 mL premix     2 g 100 mL/hr over 30 Minutes Intravenous On call to O.R. 12/29/13 [id number] 12/29/13 0744   12/27/13 0600  ceFAZolin (ANCEF) IVPB 2 g/50 mL premix  Status:  Discontinued     2 g 100 mL/hr over 30 Minutes Intravenous On call to O.R. 12/26/13 1410 12/27/13 1414   12/25/13 2330  ceFAZolin (ANCEF) IVPB 2 g/50 mL premix     2 g 100 mL/hr over 30 Minutes Intravenous Every 6 hours 12/25/13 2319 12/26/13 1430   12/25/13 2015  [MAR Hold]  gentamicin (GARAMYCIN) 200 mg in dextrose 5 % 50 mL IVPB     (On MAR Hold since 12/25/13 2043)   200 mg 110 mL/hr over 30 Minutes Intravenous  Once 12/25/13 2008 12/25/13 2140   12/25/13  2015  [MAR Hold]  penicillin g benzathine (BICILLIN LA) 1200000 UNIT/2ML injection 1.2 Million Units     (On MAR Hold since 12/25/13 2043)   1.2 Million Units Intramuscular  Once 12/25/13 2008 12/25/13 2055   12/25/13 2000  ceFAZolin (ANCEF) IVPB 2 g/50 mL premix     2 g 100 mL/hr over 30 Minutes Intravenous  Once 12/25/13 1955 12/25/13 2029    .  He was given sequential compression devices, early ambulation, and aspirin for DVT prophylaxis.  He benefited maximally from the hospital stay and there were no complications.    Recent vital signs:  Filed Vitals:   12/31/13 1437  BP: 127/69  Pulse: 86  Temp: 98.9 F (37.2 C)  Resp: 16    Recent laboratory studies:  Lab Results  Component Value Date   HGB 14.2 12/25/2013   Lab Results  Component Value Date   WBC 9.7 12/25/2013   PLT 308 12/25/2013   Lab Results  Component Value Date   INR 1.02 12/25/2013   Lab Results  Component Value Date   NA 140 12/25/2013   K 3.9 12/25/2013   CL 101 12/25/2013   CO2 23 12/25/2013   BUN 25* 12/25/2013   CREATININE 1.03 12/25/2013   GLUCOSE 126* 12/25/2013    Discharge Medications:     Medication List  HYDROcodone-acetaminophen 5-325 MG per tablet  Commonly known as:  NORCO  Take 1-2 tablets by mouth every 6 (six) hours as needed.        Diagnostic Studies: Dg Chest 2 View  12/25/2013   CLINICAL DATA:  Left leg injury.  EXAM: CHEST  2 VIEW  COMPARISON:  PA and lateral chest 04/21/2017.  FINDINGS: Lung volumes are low but the lungs are clear. Heart size is normal. No pneumothorax or pleural effusion. No focal bony abnormality.  IMPRESSION: Negative exam.   Electronically Signed   By: Drusilla Kanner M.D.   On: 12/25/2013 17:34   Dg Lumbar Spine Complete  12/25/2013   CLINICAL DATA:  Injured back at work.  EXAM: LUMBAR SPINE - COMPLETE 4+ VIEW  COMPARISON:  None.  FINDINGS: Normal alignment of the lumbar vertebral bodies. Disc spaces and vertebral bodies are maintained. Could not  exclude pars defects at L5 but no spondylolisthesis. The visualized bony pelvis is intact.  IMPRESSION: Normal alignment and no acute bony findings or significant degenerative changes.  Possible pars defects at L5 but no spondylolisthesis.   Electronically Signed   By: Loralie Champagne M.D.   On: 12/25/2013 17:33   Dg Tibia/fibula Left  12/25/2013   CLINICAL DATA:  Open wound.  EXAM: LEFT TIBIA AND FIBULA - 2 VIEW  COMPARISON:  None.  FINDINGS: The knee and ankle joints are maintained. No acute bony abnormality involving the tibia or fibula. There is a soft tissue defect involving the lateral aspect of the mid lower extremity.  IMPRESSION: No acute fracture or radiopaque foreign body.   Electronically Signed   By: Loralie Champagne M.D.   On: 12/25/2013 17:47    Disposition: 01-Home or Self Care  Discharge Instructions   Call MD / Call 911    Complete by:  As directed   If you experience chest pain or shortness of breath, CALL 911 and be transported to the hospital emergency room.  If you develope a fever above 101.5 F, pus (white drainage) or increased drainage or redness at the wound, or calf pain, call your surgeon's office.     Constipation Prevention    Complete by:  As directed   Drink plenty of fluids.  Prune juice may be helpful.  You may use a stool softener, such as Colace (over the counter) 100 mg twice a day.  Use MiraLax (over the counter) for constipation as needed.     Diet - low sodium heart healthy    Complete by:  As directed      Diet general    Complete by:  As directed      Driving restrictions    Complete by:  As directed   No driving while taking narcotic pain meds.     Increase activity slowly as tolerated    Complete by:  As directed      Weight bearing as tolerated    Complete by:  As directed            Follow-up Information   Follow up with Cheral Almas, MD On 01/06/2014.   Specialty:  Orthopedic Surgery   Contact information:   [address] [place] [address] [phone]        Signed: Cheral Almas 12/31/2013, 4:44 PM

## 2013-12-31 NOTE — Anesthesia Postprocedure Evaluation (Signed)
Anesthesia Post Note  Patient: Cory Lowery  Procedure(s) Performed: Procedure(s) (LRB): IRRIGATION AND DEBRIDEMENT RIGHT LOWER LEG, POSSIBLE CLOSURE, POSSIBLE VAC (Left)  Anesthesia type: General  Patient location: PACU  Post pain: Pain level controlled and Adequate analgesia  Post assessment: Post-op Vital signs reviewed, Patient's Cardiovascular Status Stable, Respiratory Function Stable, Patent Airway and Pain level controlled  Last Vitals:  Filed Vitals:   12/31/13 0700  BP: 110/66  Pulse: 77  Temp: 36.7 C  Resp: 16    Post vital signs: Reviewed and stable  Level of consciousness: awake, alert  and oriented  Complications: No apparent anesthesia complications

## 2013-12-31 NOTE — Progress Notes (Signed)
CSW (Clinical Child psychotherapist) aware of consult. At this time, PT is recommending no follow up. Pt has no social work needs. Please reconsult should needs arise.

## 2015-08-13 IMAGING — CR DG CHEST 2V
2 series · 2 of 2 positions shown · non-contrast
Comparison: PA and lateral chest 04/21/2017.

CLINICAL DATA: Left leg injury.

EXAM:
CHEST  2 VIEW

[w chest lat]
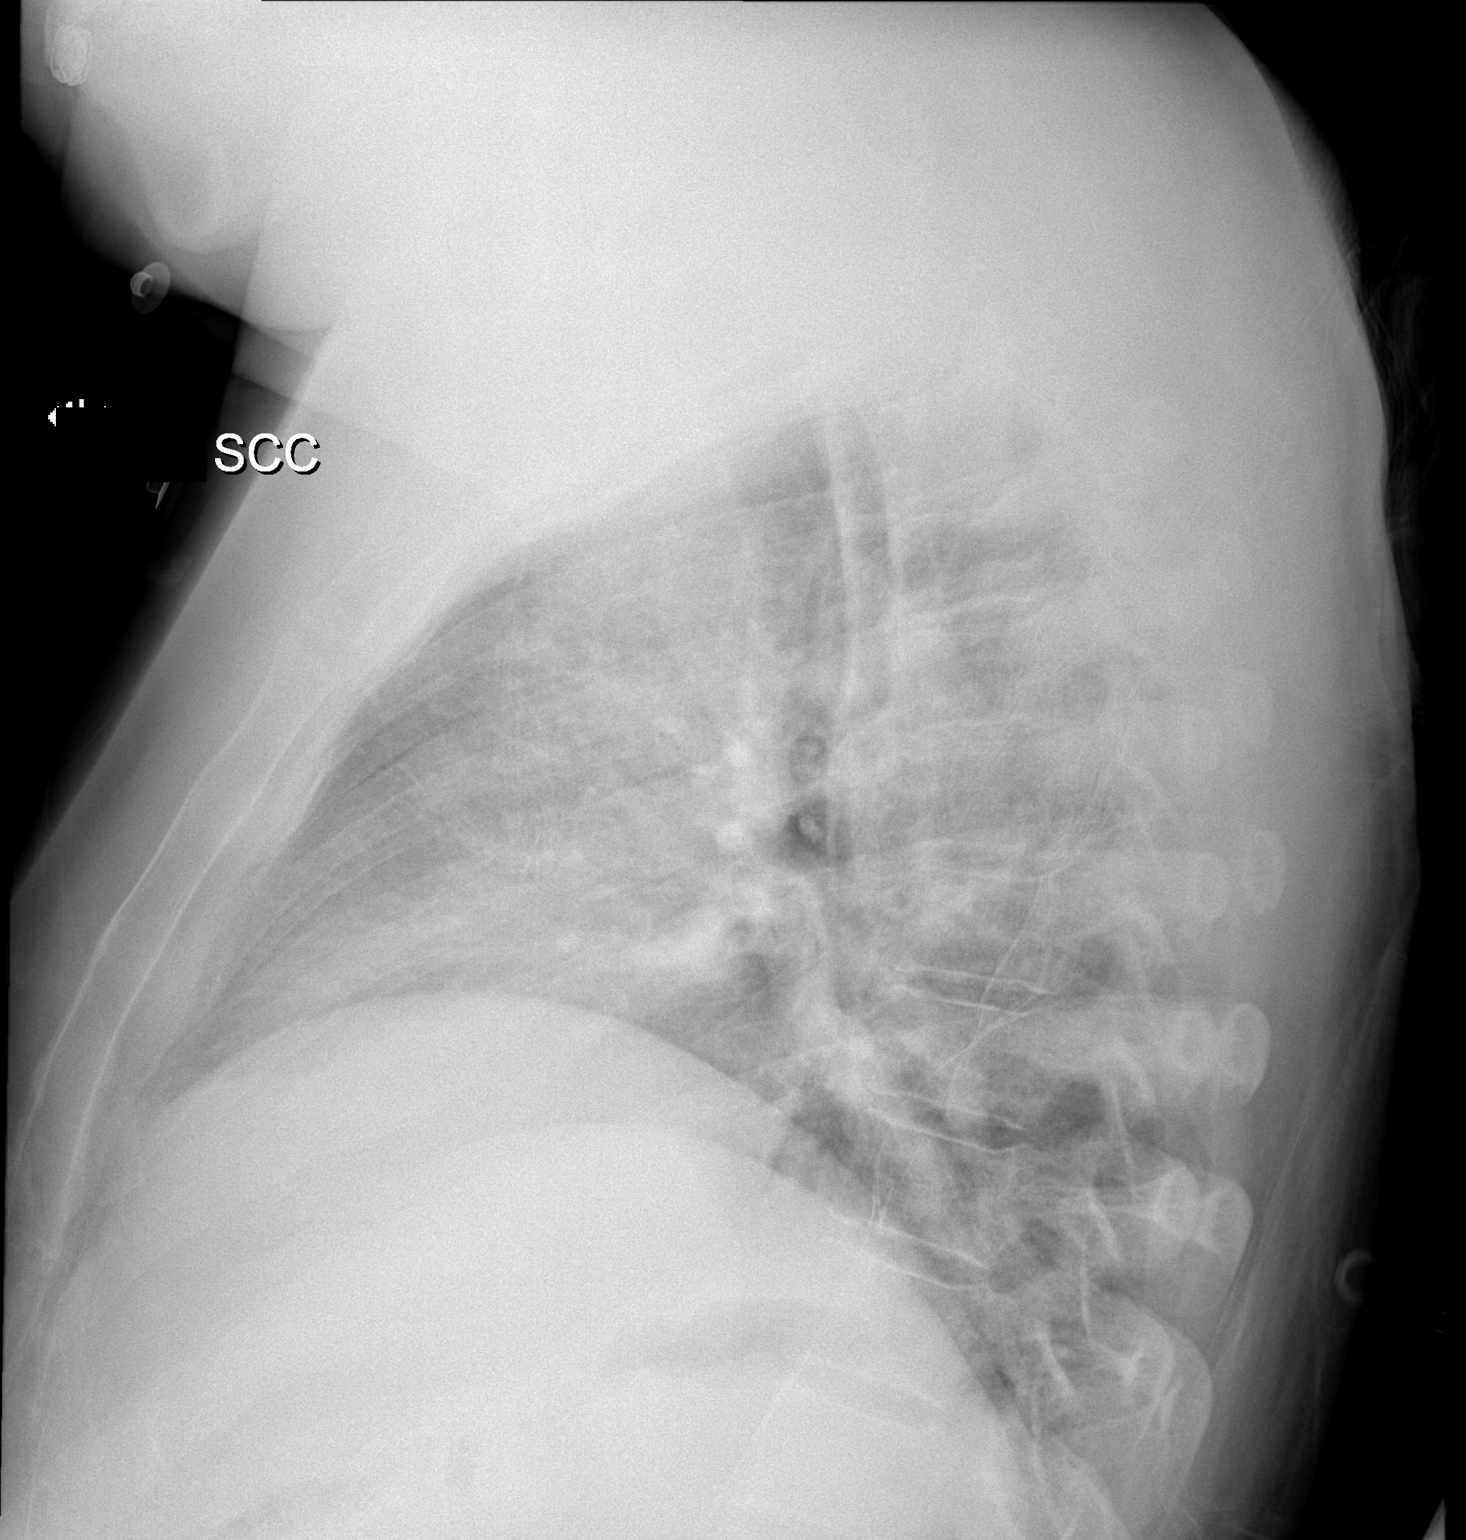

[x chest ap]
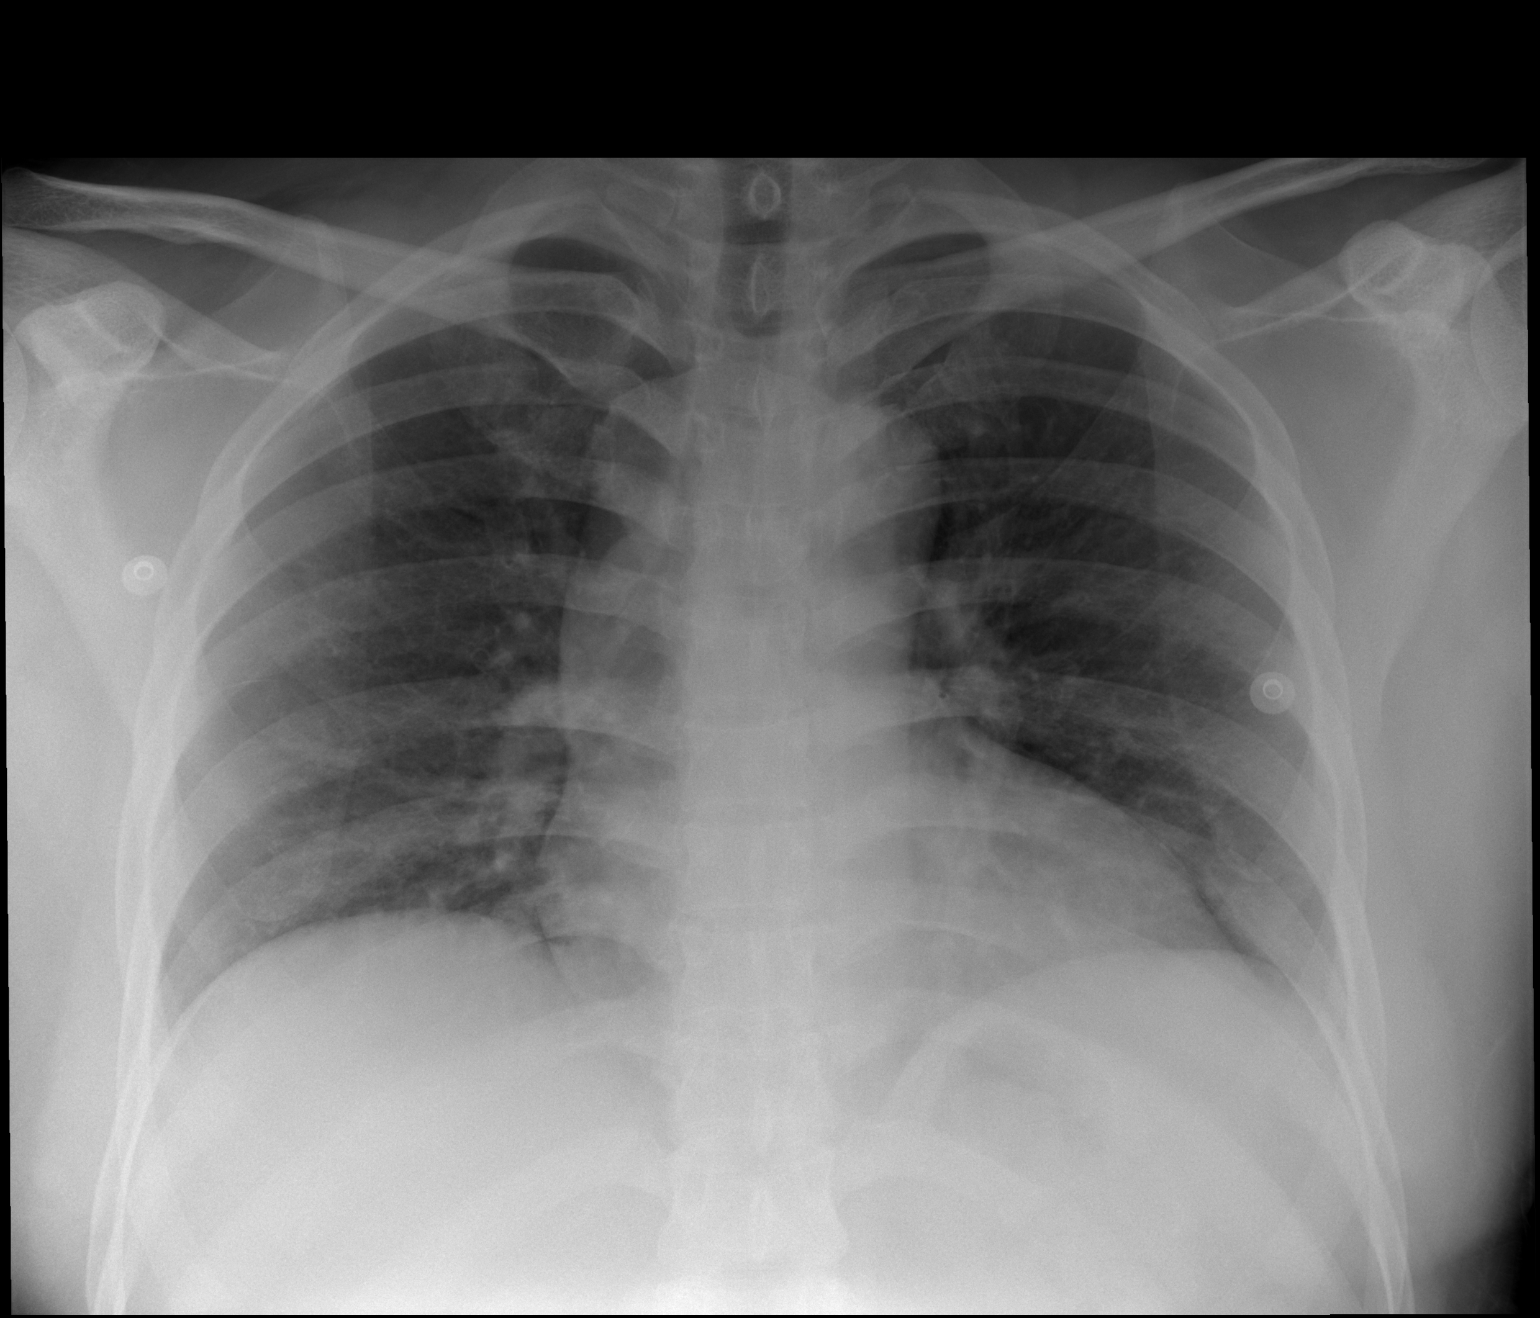

[2 of 2 positions shown; findings below may reference images not displayed]

FINDINGS: Lung volumes are low but the lungs are clear. Heart size is normal.
No pneumothorax or pleural effusion. No focal bony abnormality.
IMPRESSION: Negative exam.

## 2024-07-19 ENCOUNTER — Ambulatory Visit (HOSPITAL_BASED_OUTPATIENT_CLINIC_OR_DEPARTMENT_OTHER)
Admission: EM | Admit: 2024-07-19 | Discharge: 2024-07-19 | Disposition: A | Discharge status: Home / Self Care | Payer: Self-pay

## 2024-07-19 ENCOUNTER — Encounter (HOSPITAL_BASED_OUTPATIENT_CLINIC_OR_DEPARTMENT_OTHER): Payer: Self-pay | Admitting: Emergency Medicine

## 2024-07-19 DIAGNOSIS — E11610 Type 2 diabetes mellitus with diabetic neuropathic arthropathy: Secondary | ICD-10-CM

## 2024-07-19 DIAGNOSIS — I10 Essential (primary) hypertension: Secondary | ICD-10-CM

## 2024-07-19 LAB — GLUCOSE, POCT (MANUAL RESULT ENTRY): POCT Glucose (KUC): 315 mg/dL — AB (ref 70–99)

## 2024-07-19 MED ORDER — AMLODIPINE BESYLATE 10 MG PO TABS: 10.0000 mg | ORAL_TABLET | Freq: Every day | ORAL | 1 refills | Status: AC

## 2024-07-19 MED ORDER — METFORMIN HCL 500 MG PO TABS: 1000.0000 mg | ORAL_TABLET | Freq: Two times a day (BID) | ORAL | 1 refills | Status: AC

## 2024-07-19 MED ORDER — METHOCARBAMOL 500 MG PO TABS: 500.0000 mg | ORAL_TABLET | Freq: Two times a day (BID) | ORAL | 0 refills | Status: AC

## 2024-07-19 NOTE — Discharge Instructions (Signed)
 We are increasing the metformin to 1000 mg twice daily with meal.  Please try to avoid most high carb, high sugar foods.  I have given you some information on diet. Continue your blood pressure medication. Robaxin for muscle relaxer as needed for neck pain. You can also take tylenol  and Ibuprofen as needed.  Please follow-up with primary care for management of your high blood pressure and diabetes.

## 2024-07-19 NOTE — ED Provider Notes (Signed)
 [facility]    CSN: [id number] Arrival date & time: 07/19/24  1830      History   Chief Complaint Chief Complaint  Patient presents with   Hypertension   Hyperglycemia    HPI Cory Lowery is a 38 y.o. male.   Pt is a 38 year old male with past medical history of diabetes and hypertension.  He was most recently diagnosed with this when he was in jail.  Was prescribed amlodipine  and metformin  for this.  Recently released from jail and is about to run out of his medication.  Currently does not have a PCP.  Has an appointment scheduled on 1/7 to establish with PCP.  He is concerned due to his blood sugar still being elevated around 300.  Blood pressures have been in good range.  He has had some numbness and tingling in his feet at times.   Hypertension  Hyperglycemia   Past Medical History:  Diagnosis Date   Diabetes mellitus without complication (HCC)    Hypertension     Patient Active Problem List   Diagnosis Date Noted   Laceration of lower leg, left, complicated 12/25/2013   Laceration of lower leg, complicated 12/25/2013    Past Surgical History:  Procedure Laterality Date   APPLICATION OF WOUND VAC Left 12/25/2013   Procedure: APPLICATION OF WOUND VAC;  Surgeon: Kay Ozell Cummins, MD;  Location: [facility] OR;  Service: Orthopedics;  Laterality: Left;   I & D EXTREMITY Left 12/25/2013   Procedure: IRRIGATION AND DEBRIDEMENT LEFT LEG;  Surgeon: Kay Ozell Cummins, MD;  Location: [facility] OR;  Service: Orthopedics;  Laterality: Left;   I & D EXTREMITY Left 12/27/2013   Procedure: IRRIGATION AND DEBRIDEMENT RIGHT LOWER LEG, POSSIBLE CLOSURE, POSSIBLE VAC;  Surgeon: Kay Ozell Cummins, MD;  Location: [facility] OR;  Service: Orthopedics;  Laterality: Left;   I & D EXTREMITY Left 12/29/2013   Procedure: IRRIGATION AND DEBRIDEMENT LEFT LOWER LEG, SPLIT THICKNESS SKIN GRAFT;  Surgeon: Kay Ozell Cummins, MD;  Location: [facility] OR;  Service: Orthopedics;  Laterality: Left;        Home Medications    Prior to Admission medications  Medication Sig Start Date End Date Taking? Authorizing Provider  DULoxetine (CYMBALTA) 30 MG capsule Take 30 mg by mouth daily.   Yes [provider]  methocarbamol  (ROBAXIN ) 500 MG tablet Take 1 tablet (500 mg total) by mouth 2 (two) times daily. 07/19/24  Yes Diannie Willner A, FNP  amLODipine  (NORVASC ) 10 MG tablet Take 1 tablet (10 mg total) by mouth daily. 07/19/24 08/18/24  Adah Wilbert LABOR, FNP  metFORMIN  (GLUCOPHAGE ) 500 MG tablet Take 2 tablets (1,000 mg total) by mouth 2 (two) times daily with a meal. 07/19/24   Adah Wilbert LABOR, FNP    Family History History reviewed. No pertinent family history.  Social History Social History[1]   Allergies   Aleve [naproxen sodium]   Review of Systems Review of Systems See HPI  Physical Exam Triage Vital Signs ED Triage Vitals  Encounter Vitals Group     BP 07/19/24 1933 131/86     Girls Systolic BP Percentile --      Girls Diastolic BP Percentile --      Boys Systolic BP Percentile --      Boys Diastolic BP Percentile --      Pulse Rate 07/19/24 1933 89     Resp 07/19/24 1933 18     Temp 07/19/24 1933 98.4 F (36.9 C)  Temp Source 07/19/24 1933 Oral     SpO2 07/19/24 1933 96 %     Weight --      Height --      Head Circumference --      Peak Flow --      Pain Score 07/19/24 1929 7     Pain Loc --      Pain Education --      Exclude from Growth Chart --    No data found.  Updated Vital Signs BP 131/86 (BP Location: Right Arm)   Pulse 89   Temp 98.4 F (36.9 C) (Oral)   Resp 18   SpO2 96%   Visual Acuity Right Eye Distance:   Left Eye Distance:   Bilateral Distance:    Right Eye Near:   Left Eye Near:    Bilateral Near:     Physical Exam Constitutional:      Appearance: Normal appearance.  Cardiovascular:     Rate and Rhythm: Normal rate and regular rhythm.  Pulmonary:     Effort: Pulmonary effort is normal.     Breath sounds: Normal  breath sounds.  Musculoskeletal:        General: Normal range of motion.  Neurological:     Mental Status: He is alert.  Psychiatric:        Mood and Affect: Mood normal.      UC Treatments / Results  Labs (all labs ordered are listed, but only abnormal results are displayed) Labs Reviewed  GLUCOSE, POCT (MANUAL RESULT ENTRY) - Abnormal; Notable for the following components:      Result Value   POCT Glucose ([facility]) 315 (*)    All other components within normal limits    EKG   Radiology No results found.  Procedures Procedures (including critical care time)  Medications Ordered in UC Medications - No data to display  Initial Impression / Assessment and Plan / UC Course  I have reviewed the triage vital signs and the nursing notes.  Pertinent labs & imaging results that were available during my care of the patient were reviewed by me and considered in my medical decision making (see chart for details).     Type 2 diabetes-patient currently on metformin  500 mg twice daily.  Blood sugars have been running in the 300s.  Blood sugar today was 315.  Patient was not informed on specific diet changes that need to be made during treatment of diabetes.  Patient explained about diet changes and ways to bring his blood sugars down.  I am also increasing his metformin  to 1000 mg twice daily.  I have refilled his medications.  He will follow-up PCP as planned  Essential hypertension-continue amlodipine  10 mg daily.  Blood pressure was 131/86 today.  Monitor blood pressures at home.  Follow-up with PCP as planned Final Clinical Impressions(s) / UC Diagnoses   Final diagnoses:  Type 2 diabetes mellitus with diabetic neuropathic arthropathy, without long-term current use of insulin (HCC)  Essential hypertension     Discharge Instructions      We are increasing the metformin  to 1000 mg twice daily with meal.  Please try to avoid most high carb, high sugar foods.  I have given you  some information on diet. Continue your blood pressure medication. Robaxin  for muscle relaxer as needed for neck pain. You can also take tylenol  and Ibuprofen as needed.  Please follow-up with primary care for management of your high blood pressure and diabetes.  ED Prescriptions     Medication Sig Dispense Auth. Provider   metFORMIN  (GLUCOPHAGE ) 500 MG tablet Take 2 tablets (1,000 mg total) by mouth 2 (two) times daily with a meal. 120 tablet Kess Mcilwain A, FNP   amLODipine  (NORVASC ) 10 MG tablet Take 1 tablet (10 mg total) by mouth daily. 30 tablet Lenah Messenger A, FNP   methocarbamol  (ROBAXIN ) 500 MG tablet Take 1 tablet (500 mg total) by mouth 2 (two) times daily. 20 tablet Adah Wilbert LABOR, FNP      PDMP not reviewed this encounter.     [1]  Social History Tobacco Use   Smoking status: Never   Smokeless tobacco: Never  Vaping Use   Vaping status: Never Used  Substance Use Topics   Alcohol use: Never   Drug use: Never     Adah Wilbert LABOR, FNP 07/22/24 1425

## 2024-07-19 NOTE — ED Triage Notes (Signed)
 Pt was in [place] and they noticed pt was having high blood pressure he was prescribed Amlopidine 10mg  on 11/13 and a few days later they noticed his blood sugar in the morning around 300, this morning he was 280, they prescribed him Metformin 500mg  on 11/22. Pt has appointment 01/07 to establish PCP care but he is currently out of the medication.
